# Patient Record
Sex: Female | Born: 1960
Health system: Southern US, Community
[De-identification: ages and names within clinical notes are randomized; demographics above are authoritative.]

## PROBLEM LIST (undated history)

## (undated) DIAGNOSIS — E079 Disorder of thyroid, unspecified: Secondary | ICD-10-CM

## (undated) DIAGNOSIS — K219 Gastro-esophageal reflux disease without esophagitis: Secondary | ICD-10-CM

## (undated) DIAGNOSIS — G809 Cerebral palsy, unspecified: Secondary | ICD-10-CM

## (undated) DIAGNOSIS — I10 Essential (primary) hypertension: Secondary | ICD-10-CM

## (undated) DIAGNOSIS — K589 Irritable bowel syndrome without diarrhea: Secondary | ICD-10-CM

## (undated) HISTORY — PX: LEG SURGERY: SHX1003

## (undated) HISTORY — DX: Cerebral palsy, unspecified: G80.9

## (undated) HISTORY — DX: Disorder of thyroid, unspecified: E07.9

---

## 1997-11-24 ENCOUNTER — Encounter: Admission: RE | Admit: 1997-11-24 | Discharge: 1998-02-22 | Payer: Self-pay | Admitting: Internal Medicine

## 1997-12-21 ENCOUNTER — Other Ambulatory Visit: Admission: RE | Admit: 1997-12-21 | Discharge: 1997-12-21 | Payer: Self-pay | Admitting: Internal Medicine

## 2000-05-21 ENCOUNTER — Ambulatory Visit (HOSPITAL_COMMUNITY): Admission: RE | Admit: 2000-05-21 | Discharge: 2000-05-21 | Payer: Self-pay | Admitting: Internal Medicine

## 2000-05-21 ENCOUNTER — Encounter: Payer: Self-pay | Admitting: Internal Medicine

## 2007-10-27 ENCOUNTER — Encounter: Payer: Self-pay | Admitting: Family Medicine

## 2007-11-02 ENCOUNTER — Ambulatory Visit: Payer: Self-pay | Admitting: Family Medicine

## 2007-11-02 DIAGNOSIS — I10 Essential (primary) hypertension: Secondary | ICD-10-CM | POA: Insufficient documentation

## 2007-11-03 ENCOUNTER — Encounter: Payer: Self-pay | Admitting: Family Medicine

## 2007-11-03 LAB — CONVERTED CEMR LAB
ALT: 9 units/L (ref 0–35)
AST: 15 units/L (ref 0–37)
Albumin: 4 g/dL (ref 3.5–5.2)
Alkaline Phosphatase: 84 units/L (ref 39–117)
BUN: 11 mg/dL (ref 6–23)
CO2: 21 meq/L (ref 19–32)
Calcium: 8.4 mg/dL (ref 8.4–10.5)
Chloride: 104 meq/L (ref 96–112)
Cholesterol: 126 mg/dL (ref 0–200)
Creatinine, Ser: 0.67 mg/dL (ref 0.40–1.20)
Glucose, Bld: 77 mg/dL (ref 70–99)
HDL: 49 mg/dL (ref >39)
LDL Cholesterol: 68 mg/dL (ref 0–99)
Potassium: 3.7 meq/L (ref 3.5–5.3)
Sodium: 138 meq/L (ref 135–145)
TSH: 1.588 microintl units/mL (ref 0.350–5.50)
Total Bilirubin: 0.6 mg/dL (ref 0.3–1.2)
Total CHOL/HDL Ratio: 2.6
Total Protein: 7.7 g/dL (ref 6.0–8.3)
Triglycerides: 47 mg/dL (ref <150)
VLDL: 9 mg/dL (ref 0–40)

## 2007-11-11 ENCOUNTER — Encounter: Admission: RE | Admit: 2007-11-11 | Discharge: 2007-11-11 | Payer: Self-pay | Admitting: Family Medicine

## 2007-12-08 ENCOUNTER — Ambulatory Visit: Payer: Self-pay | Admitting: Family Medicine

## 2007-12-08 DIAGNOSIS — E559 Vitamin D deficiency, unspecified: Secondary | ICD-10-CM | POA: Insufficient documentation

## 2007-12-10 LAB — CONVERTED CEMR LAB: Vit D, 1,25-Dihydroxy: 70 (ref 30–89)

## 2007-12-23 ENCOUNTER — Encounter: Payer: Self-pay | Admitting: Family Medicine

## 2007-12-29 ENCOUNTER — Ambulatory Visit: Payer: Self-pay | Admitting: Family Medicine

## 2008-02-16 ENCOUNTER — Ambulatory Visit: Payer: Self-pay | Admitting: Family Medicine

## 2008-03-17 ENCOUNTER — Ambulatory Visit: Payer: Self-pay | Admitting: Family Medicine

## 2008-03-28 ENCOUNTER — Encounter: Admission: RE | Admit: 2008-03-28 | Discharge: 2008-03-28 | Payer: Self-pay | Admitting: Family Medicine

## 2008-04-26 ENCOUNTER — Telehealth (INDEPENDENT_AMBULATORY_CARE_PROVIDER_SITE_OTHER): Payer: Self-pay | Admitting: *Deleted

## 2009-05-03 ENCOUNTER — Ambulatory Visit: Payer: Self-pay | Admitting: Family Medicine

## 2009-05-04 ENCOUNTER — Encounter: Payer: Self-pay | Admitting: Family Medicine

## 2009-05-07 LAB — CONVERTED CEMR LAB
ALT: 12 units/L (ref 0–35)
AST: 16 units/L (ref 0–37)
Albumin: 4 g/dL (ref 3.5–5.2)
Alkaline Phosphatase: 68 units/L (ref 39–117)
BUN: 18 mg/dL (ref 6–23)
CO2: 20 meq/L (ref 19–32)
Calcium: 8.7 mg/dL (ref 8.4–10.5)
Chloride: 102 meq/L (ref 96–112)
Cholesterol: 171 mg/dL (ref 0–200)
Creatinine, Ser: 0.78 mg/dL (ref 0.40–1.20)
Glucose, Bld: 91 mg/dL (ref 70–99)
HDL: 57 mg/dL (ref >39)
LDL Cholesterol: 101 mg/dL — ABNORMAL HIGH (ref 0–99)
Potassium: 3.5 meq/L (ref 3.5–5.3)
Sodium: 137 meq/L (ref 135–145)
Total Bilirubin: 0.5 mg/dL (ref 0.3–1.2)
Total CHOL/HDL Ratio: 3
Total Protein: 8.1 g/dL (ref 6.0–8.3)
Triglycerides: 64 mg/dL (ref <150)
VLDL: 13 mg/dL (ref 0–40)
Vit D, 25-Hydroxy: 61 ng/mL (ref 30–89)

## 2009-05-09 ENCOUNTER — Encounter: Admission: RE | Admit: 2009-05-09 | Discharge: 2009-05-09 | Payer: Self-pay | Admitting: Family Medicine

## 2009-05-09 LAB — HM MAMMOGRAPHY: HM Mammogram: NORMAL

## 2009-11-07 ENCOUNTER — Ambulatory Visit: Payer: Self-pay | Admitting: Family Medicine

## 2009-11-07 DIAGNOSIS — R1314 Dysphagia, pharyngoesophageal phase: Secondary | ICD-10-CM | POA: Insufficient documentation

## 2009-11-08 ENCOUNTER — Telehealth: Payer: Self-pay | Admitting: Family Medicine

## 2009-11-13 ENCOUNTER — Encounter: Payer: Self-pay | Admitting: Family Medicine

## 2009-11-20 ENCOUNTER — Encounter: Payer: Self-pay | Admitting: Family Medicine

## 2009-11-21 ENCOUNTER — Ambulatory Visit: Payer: Self-pay | Admitting: Family Medicine

## 2009-11-30 ENCOUNTER — Encounter: Payer: Self-pay | Admitting: Family Medicine

## 2009-12-22 ENCOUNTER — Ambulatory Visit: Payer: Self-pay | Admitting: Family Medicine

## 2009-12-22 DIAGNOSIS — L219 Seborrheic dermatitis, unspecified: Secondary | ICD-10-CM | POA: Insufficient documentation

## 2010-05-15 ENCOUNTER — Ambulatory Visit: Payer: Self-pay | Admitting: Family Medicine

## 2010-09-16 ENCOUNTER — Encounter: Payer: Self-pay | Admitting: Internal Medicine

## 2010-09-25 NOTE — Assessment & Plan Note (Signed)
Summary: Shingles.     Vital Signs:  Patient profile:   50 year old female Height:      61 inches Temp:     99.1 degrees F oral Pulse rate:   110 / minute BP sitting:   154 / 92  (left arm) Cuff size:   regular  Vitals Entered By: Kathlene November (November 21, 2009 2:33 PM) CC: rash on neck, face and around left ear- started Friday night- started Prevacid Friday that the GI MD gave her and said that is when the rash started. No itching- painful   Primary Care Provider:  Linford Arnold, C  CC:  rash on neck and face and around left ear- started Friday night- started Prevacid Friday that the GI MD gave her and said that is when the rash started. No itching- painful.  History of Present Illness: rash on neck, face and around right ear- started Friday night- started Prevacid Friday that the GI MD gave her and said that is when the rash started. No itching- painful. Says had shingles several years ago on her left hip   No alleviating or aggrevating sxs.   Current Medications (verified): 1)  Macrodantin 100 Mg  Caps (Nitrofurantoin Macrocrystal) .... Take 1 Tablet By Mouth Once A Day 2)  Oxybutynin Chloride 5 Mg  Tabs (Oxybutynin Chloride) .... Take 1 Tablet By Mouth Three Times A Day 3)  Dicyclomine Hcl 20 Mg  Tabs (Dicyclomine Hcl) .... Take One By Mouth Four Times Daily As Needed 4)  Lisinopril-Hydrochlorothiazide 10-12.5 Mg  Tabs (Lisinopril-Hydrochlorothiazide) .... Take 1 Tablet By Mouth Once A Day 5)  Vitamin D 2000 Unit  Tabs (Cholecalciferol) .... Take 1 Tablet By Mouth Once A Day  Allergies (verified): No Known Drug Allergies  Comments:  Nurse/Medical Assistant: The patient's medications and allergies were reviewed with the patient and were updated in the Medication and Allergy Lists. Kathlene November (November 21, 2009 2:35 PM)  Physical Exam  General:  Well-developed,well-nourished,in no acute distress; alert,appropriate and cooperative throughout examination Ears:  Left TM is clear. No  vesicles on teh ear.  Skin:  Erythematous maculopapular rash on her right ear, left facial cheek and onto her neck. Doesn't cross midline.  Scattered vesicles.    Impression & Recommendations:  Problem # 1:  SHINGLES (ICD-053.9)  Discussed tx with antiviral, famciclovir and topical lidocaine gel. If needs something for pain control stronger then call the office and let me know. Will send viral culture as well to confirm.  Has appt at [facility] next tuesday.    Orders: T- * Misc. Laboratory test [phone])  Complete Medication List: 1)  Macrodantin 100 Mg Caps (Nitrofurantoin macrocrystal) .... Take 1 tablet by mouth once a day 2)  Oxybutynin Chloride 5 Mg Tabs (Oxybutynin chloride) .... Take 1 tablet by mouth three times a day 3)  Dicyclomine Hcl 20 Mg Tabs (Dicyclomine hcl) .... Take one by mouth four times daily as needed 4)  Lisinopril-hydrochlorothiazide 10-12.5 Mg Tabs (Lisinopril-hydrochlorothiazide) .... Take 1 tablet by mouth once a day 5)  Vitamin D 2000 Unit Tabs (Cholecalciferol) .... Take 1 tablet by mouth once a day 6)  Famciclovir 500 Mg Tabs (Famciclovir) .... Take 1 tablet by mouth three times a day for 7 days . 7)  Lidocaine Hcl 2 % Gel (Lidocaine hcl) .... Apply to rash up to 3 x a day for pain relief. Prescriptions: LIDOCAINE HCL 2 % GEL (LIDOCAINE HCL) Apply to rash up to 3 x a day for pain  relief.  #1 tube x 0   Entered and Authorized by:   Nani Gasser MD   Signed by:   Nani Gasser MD on 11/21/2009   Method used:   [facility] to        [facility] [phone]* (retail)       [address] [place], [place]  [address]       Ph: [phone] or [phone]       Fax: [phone]   RxID:   [id number] FAMCICLOVIR 500 MG TABS (FAMCICLOVIR) Take 1 tablet by mouth three times a day for 7 days .  #21 x 0   Entered and Authorized by:   Nani Gasser MD   Signed by:   Nani Gasser MD on 11/21/2009   Method used:    [facility] to        [facility] [phone]* (retail)       [address] [place], [place]  [address]       Ph: [phone] or [phone]       Fax: [phone]   RxID:   [id number]

## 2010-09-25 NOTE — Progress Notes (Signed)
----   Converted from flag ---- ---- 11/08/2009 11:21 AM, Darral Dash wrote:   ---- 11/08/2009 11:21 AM, Kathlene November wrote: Yes you can cancel the appt but make sure to document that pt declined the appt and why  ---- 11/08/2009 10:33 AM, Darral Dash wrote:  Blanca Friend   please see referral note   appt made for pt with GI / but said she did not want appt.  Should I cancel appt? ------------------------------

## 2010-09-25 NOTE — Consult Note (Signed)
Summary: [facility]  [facility]   Imported By: Lanelle Bal 11/17/2009 08:36:19  _____________________________________________________________________  External Attachment:    Type:   Image     Comment:   External Document

## 2010-09-25 NOTE — Assessment & Plan Note (Signed)
Summary: 6 MONTH FU HTN   Vital Signs:  Patient profile:   50 year old female Height:      61 inches Pulse rate:   77 / minute BP sitting:   135 / 78  (left arm) Cuff size:   regular  Vitals Entered By: Avon Gully CMA, Duncan Dull) (May 15, 2010 2:32 PM) CC: f/u BP med, Hypertension Management   CC:  f/u BP med and Hypertension Management.  Hypertension History:      She denies headache, chest pain, palpitations, dyspnea with exertion, orthopnea, PND, peripheral edema, visual symptoms, neurologic problems, syncope, and side effects from treatment.  She notes no problems with any antihypertensive medication side effects.        Positive major cardiovascular risk factors include hypertension.  Negative major cardiovascular risk factors include female age less than 10 years old, negative family history for ischemic heart disease, and non-tobacco-user status.     Current Medications (verified): 1)  Macrodantin 100 Mg  Caps (Nitrofurantoin Macrocrystal) .... Take 1 Tablet By Mouth Once A Day 2)  Oxybutynin Chloride 5 Mg  Tabs (Oxybutynin Chloride) .... Take 1 Tablet By Mouth Three Times A Day 3)  Dicyclomine Hcl 20 Mg  Tabs (Dicyclomine Hcl) .... Take One By Mouth Four Times Daily As Needed 4)  Lisinopril-Hydrochlorothiazide 10-12.5 Mg  Tabs (Lisinopril-Hydrochlorothiazide) .... Take 1 Tablet By Mouth Once A Day 5)  Vitamin D 2000 Unit  Tabs (Cholecalciferol) .... Take 1 Tablet By Mouth Once A Day 6)  Lidocaine Hcl 2 % Gel (Lidocaine Hcl) .... Apply To Rash Up To 3 X A Day For Pain Relief. 7)  Triamcinolone Acetonide 0.5 % Crea (Triamcinolone Acetonide) .... Apply Once A Day As Needed To Affected Area.  Allergies (verified): No Known Drug Allergies  Comments:  Nurse/Medical Assistant: The patient's medications and allergies were reviewed with the patient and were updated in the Medication and Allergy Lists. Avon Gully CMA, Duncan Dull) (May 15, 2010 2:33 PM)  Physical  Exam  General:  Well-developed,well-nourished,in no acute distress; alert,appropriate and cooperative throughout examination Head:  Normocephalic and atraumatic without obvious abnormalities. No apparent alopecia or balding. Eyes:  No corneal or conjunctival inflammation noted. EOMI. Perrla.  Lungs:  Normal respiratory effort, chest expands symmetrically. Lungs are clear to auscultation, no crackles or wheezes. Heart:  Normal rate and regular rhythm. S1 and S2 normal without gallop, murmur, click, rub or other extra sounds. Extremities:  No LE edema.    Impression & Recommendations:  Problem # 1:  ESSENTIAL HYPERTENSION, BENIGN (ICD-401.1)  At goal today.  Due for labs.  Her updated medication list for this problem includes:    Lisinopril-hydrochlorothiazide 10-12.5 Mg Tabs (Lisinopril-hydrochlorothiazide) .Marland Kitchen... Take 1 tablet by mouth once a day  BP today: 135/78 Prior BP: 128/73 (12/22/2009)  Prior 10 Yr Risk Heart Disease: 3 % (05/03/2009)  Labs Reviewed: K+: 3.5 (05/04/2009) Creat: : 0.78 (05/04/2009)   Chol: 171 (05/04/2009)   HDL: 57 (05/04/2009)   LDL: 101 (05/04/2009)   TG: 64 (05/04/2009)  Orders: T-Comprehensive Metabolic Panel ([id number]) T-Lipid Profile ([id number])  Complete Medication List: 1)  Macrodantin 100 Mg Caps (Nitrofurantoin macrocrystal) .... Take 1 tablet by mouth once a day 2)  Oxybutynin Chloride 5 Mg Tabs (Oxybutynin chloride) .... Take 1 tablet by mouth three times a day 3)  Dicyclomine Hcl 20 Mg Tabs (Dicyclomine hcl) .... Take one by mouth four times daily as needed 4)  Lisinopril-hydrochlorothiazide 10-12.5 Mg Tabs (Lisinopril-hydrochlorothiazide) .... Take 1 tablet by mouth once a  day 5)  Vitamin D 2000 Unit Tabs (Cholecalciferol) .... Take 1 tablet by mouth once a day 6)  Lidocaine Hcl 2 % Gel (Lidocaine hcl) .... Apply to rash up to 3 x a day for pain relief. 7)  Triamcinolone Acetonide 0.5 % Crea (Triamcinolone acetonide) .... Apply once a  day as needed to affected area.  Hypertension Assessment/Plan:      The patient's hypertensive risk group is category A: No risk factors and no target organ damage.  Her calculated 10 year risk of coronary heart disease is 4 %.  Today's blood pressure is 135/78.  Her blood pressure goal is < 140/90.  Contraindications/Deferment of Procedures/Staging:    Test/Procedure: FLU VAX    Reason for deferment: patient declined   Patient Instructions: 1)  Go for fasting labs in the next month.   2)  Call Dr. Yevonne Pax office to schedule a follow up visit.  3)  Please schedule a follow-up appointment in 6 months for blood pressure .

## 2010-09-25 NOTE — Assessment & Plan Note (Signed)
Summary: 6 MONTH FU HTN, new onset dysphagia   Vital Signs:  Patient profile:   50 year old female Height:      61 inches Pulse rate:   107 / minute BP sitting:   132 / 79  (left arm) Cuff size:   regular  Vitals Entered By: Kathlene November (November 07, 2009 2:24 PM) CC: followup BP   Primary Care Provider:  Linford Arnold, C  CC:  followup BP.  History of Present Illness: Anna Bowen is a 50 year old woman presenting for f/u of BP. She has no symptoms or concerns. Her blood pressure today is 132/79. She takes her lisinopril-HCTZ daily and needs a refill today. She denies headache, chest pain, palpitations, dyspnea with exertion, peripheral edema, visual symptoms, neurologic problems, or syncope. She notes no problems with any antihypertensive medication side effects.  For the past month she has noticed that food seems to get stuck in her mid-esophagus every time she eats. Only solids, no liquids. Laying down or eating something else makes it better. She is always eventually able to get it down, but she has vomited from this problem before. She has had some loose stools occ, but no constipation. Sometimes experiences brash taste. No changes in diet. No history of reflux. No abdominal pain. Doesn't have her bottom teeth to help her chew but this is not new and her dysphagia is.  Denies any change in her diet recently.    Current Medications (verified): 1)  Macrodantin 100 Mg  Caps (Nitrofurantoin Macrocrystal) .... Take 1 Tablet By Mouth Once A Day 2)  Oxybutynin Chloride 5 Mg  Tabs (Oxybutynin Chloride) .... Take 1 Tablet By Mouth Three Times A Day 3)  Dicyclomine Hcl 20 Mg  Tabs (Dicyclomine Hcl) .... Take One By Mouth Four Times Daily As Needed 4)  Lisinopril-Hydrochlorothiazide 10-12.5 Mg  Tabs (Lisinopril-Hydrochlorothiazide) .... Take 1 Tablet By Mouth Once A Day 5)  Vitamin D 2000 Unit  Tabs (Cholecalciferol) .... Take 1 Tablet By Mouth Once A Day  Allergies (verified): No Known Drug  Allergies  Comments:  Nurse/Medical Assistant: The patient's medications and allergies were reviewed with the patient and were updated in the Medication and Allergy Lists. Kathlene November (November 07, 2009 2:25 PM)  Physical Exam  General:  Well-developed, well-nourised female sitting in wheelchair in no acute distress.  Eyes:  Asymmetric gaze. PERRLA. Sclera clear with no injection.  Mouth:  Oropharynx pink and moist.  Neck:  Supple with no lymphadenopathy or thyromegaly. Chest Wall:  No deformities, masses, or tenderness noted. Lungs:  Normal respiratory effort, chest expands symmetrically. Lungs are clear to auscultation, no crackles or wheezes. Heart:  Normal rate and regular rhythm. S1 and S2 normal without gallop, murmur, click, rub or other extra sounds. Msk:  Posture kyphotic.  Pulses:  Radial 2+ bilaterally.  Extremities:  No cyanosis, clubbing, or edema.  Skin:  no suspicious lesions.   Psych:  Alert and cooperative with normal concentration and attention.    Impression & Recommendations:  Problem # 1:  ESSENTIAL HYPERTENSION, BENIGN (ICD-401.1) BP today 132/79. Continue lisinopril-HCTZ. Follow up in 6 months to recheck BP and labs.   Her updated medication list for this problem includes:    Lisinopril-hydrochlorothiazide 10-12.5 Mg Tabs (Lisinopril-hydrochlorothiazide) .Marland Kitchen... Take 1 tablet by mouth once a day  Problem # 2:  DYSPHAGIA, PHARYNGOESOPHAGEAL PHASE ([id number]) Assessment: New  Dysphagia to solids at every meal in setting of intermittent brash taste suggests possible stricture secondary to GERD. Will refer to GI for  upper endoscopy and possible dilation. In meantime try to eat mostly soft foods that don't require alot of chewing.   Orders: Gastroenterology Referral (GI)  Complete Medication List: 1)  Macrodantin 100 Mg Caps (Nitrofurantoin macrocrystal) .... Take 1 tablet by mouth once a day 2)  Oxybutynin Chloride 5 Mg Tabs (Oxybutynin chloride) .... Take 1  tablet by mouth three times a day 3)  Dicyclomine Hcl 20 Mg Tabs (Dicyclomine hcl) .... Take one by mouth four times daily as needed 4)  Lisinopril-hydrochlorothiazide 10-12.5 Mg Tabs (Lisinopril-hydrochlorothiazide) .... Take 1 tablet by mouth once a day 5)  Vitamin D 2000 Unit Tabs (Cholecalciferol) .... Take 1 tablet by mouth once a day  Patient Instructions: 1)  Please schedule a follow-up appointment in 6 months for blood pressure and labs. 2)  We will call you with an appointment for your swallowing problem.   Prescriptions: DICYCLOMINE HCL 20 MG  TABS (DICYCLOMINE HCL) take one by mouth four times daily as needed  #120 x 3   Entered and Authorized by:   Nani Gasser MD   Signed by:   Nani Gasser MD on 11/07/2009   Method used:   [facility] to        [facility] [phone]* (retail)       [address] [place], [place]  [address]       Ph: [phone] or [phone]       Fax: [phone]   RxID:   [id number] LISINOPRIL-HYDROCHLOROTHIAZIDE 10-12.5 MG  TABS (LISINOPRIL-HYDROCHLOROTHIAZIDE) Take 1 tablet by mouth once a day  #90 Tablet x 2   Entered and Authorized by:   Nani Gasser MD   Signed by:   Nani Gasser MD on 11/07/2009   Method used:   Electronically to        [facility] [phone]* (retail)       [address] [place], [place]  [address]       Ph: [phone] or [phone]       Fax: [phone]   RxID:   [id number]

## 2010-09-25 NOTE — Assessment & Plan Note (Signed)
Summary: Shingles, seb dermatitis   Vital Signs:  Patient profile:   50 year old female Height:      61 inches O2 Sat:      98 % on Room air Temp:     99.7 degrees F oral Pulse rate:   112 / minute BP sitting:   128 / 73  (left arm) Cuff size:   regular  Vitals Entered By: Payton Spark CMA (December 22, 2009 1:57 PM)  O2 Flow:  Room air CC: F/U rash   Primary Care Provider:  Linford Arnold, C  CC:  F/U rash.  History of Present Illness: Completed medicaition and overall rash is improved.  Area behind the right ear It is itchy and has been there about 3 weeks. Never had it before.   Allergies: No Known Drug Allergies  Physical Exam  General:  Well-developed,well-nourished,in no acute distress; alert,appropriate and cooperative throughout examination Skin:  Area behind right ear is dry adn mildly erythenatous and extends into the scalp area.    Impression & Recommendations:  Problem # 1:  SHINGLES (ICD-053.9) Culture came back negative but exam was very consistant with shingles. Resolved.   Problem # 2:  DERMATITIS, SEBORRHEIC (ICD-690.10) Dsicussed treatment with selsun blue for entire scallp and then use the triamcinolone topically to the area behind the right ear. Call if not better in 2 weeks.   Problem # 3:  ESSENTIAL HYPERTENSION, BENIGN (ICD-401.1) Looks good today.   Her updated medication list for this problem includes:    Lisinopril-hydrochlorothiazide 10-12.5 Mg Tabs (Lisinopril-hydrochlorothiazide) .Marland Kitchen... Take 1 tablet by mouth once a day  Complete Medication List: 1)  Macrodantin 100 Mg Caps (Nitrofurantoin macrocrystal) .... Take 1 tablet by mouth once a day 2)  Oxybutynin Chloride 5 Mg Tabs (Oxybutynin chloride) .... Take 1 tablet by mouth three times a day 3)  Dicyclomine Hcl 20 Mg Tabs (Dicyclomine hcl) .... Take one by mouth four times daily as needed 4)  Lisinopril-hydrochlorothiazide 10-12.5 Mg Tabs (Lisinopril-hydrochlorothiazide) .... Take 1 tablet by  mouth once a day 5)  Vitamin D 2000 Unit Tabs (Cholecalciferol) .... Take 1 tablet by mouth once a day 6)  Lidocaine Hcl 2 % Gel (Lidocaine hcl) .... Apply to rash up to 3 x a day for pain relief. 7)  Triamcinolone Acetonide 0.5 % Crea (Triamcinolone acetonide) .... Apply once a day as needed to affected area.  Patient Instructions: 1)  Use Selsun Blue shampoo. Can use the topical steroid cream (triamcinolone) once a day as needed. Give skin a break if use for more than 2 weeks.  Prescriptions: TRIAMCINOLONE ACETONIDE 0.5 % CREA (TRIAMCINOLONE ACETONIDE) apply once a day as needed to affected area.  #20 gram. x 1   Entered and Authorized by:   Nani Gasser MD   Signed by:   Nani Gasser MD on 12/22/2009   Method used:   Electronically to        [facility] [phone]* (retail)       [address] [place], [place]  [address]       Ph: [phone] or [phone]       Fax: [phone]   RxID:   [id number]

## 2010-09-28 NOTE — Letter (Signed)
Summary: Letter with Esophagram Results/[facility]  Letter with Esophagram Results/[facility]   Imported By: Lanelle Bal 12/09/2009 10:23:18  _____________________________________________________________________  External Attachment:    Type:   Image     Comment:   External Document

## 2011-01-23 ENCOUNTER — Other Ambulatory Visit: Payer: Self-pay | Admitting: Family Medicine

## 2011-01-24 ENCOUNTER — Telehealth: Payer: Self-pay | Admitting: Family Medicine

## 2011-01-24 DIAGNOSIS — K279 Peptic ulcer, site unspecified, unspecified as acute or chronic, without hemorrhage or perforation: Secondary | ICD-10-CM

## 2011-01-24 MED ORDER — DICYCLOMINE HCL 20 MG PO TABS: 20.0000 mg | ORAL_TABLET | Freq: Four times a day (QID) | ORAL | Status: DC | PRN

## 2011-01-25 NOTE — Telephone Encounter (Signed)
Close encounter. Anna Newcomer, LPN Domingo Dimes

## 2011-01-27 ENCOUNTER — Encounter: Payer: Self-pay | Admitting: Family Medicine

## 2011-01-28 ENCOUNTER — Encounter: Payer: Self-pay | Admitting: Family Medicine

## 2011-01-28 ENCOUNTER — Ambulatory Visit (INDEPENDENT_AMBULATORY_CARE_PROVIDER_SITE_OTHER): Payer: PRIVATE HEALTH INSURANCE | Admitting: Family Medicine

## 2011-01-28 VITALS — BP 157/80 | HR 125 | Temp 98.6°F

## 2011-01-28 DIAGNOSIS — G809 Cerebral palsy, unspecified: Secondary | ICD-10-CM

## 2011-01-28 DIAGNOSIS — R1314 Dysphagia, pharyngoesophageal phase: Secondary | ICD-10-CM

## 2011-01-28 DIAGNOSIS — I10 Essential (primary) hypertension: Secondary | ICD-10-CM

## 2011-01-28 DIAGNOSIS — J689 Unspecified respiratory condition due to chemicals, gases, fumes and vapors: Secondary | ICD-10-CM

## 2011-01-28 MED ORDER — WHEELCHAIR MISC: Status: DC

## 2011-01-28 MED ORDER — AMBULATORY NON FORMULARY MEDICATION: Status: DC
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
## 2011-01-28 NOTE — Assessment & Plan Note (Signed)
Is not well-controlled today but she did just restart her medications. I would like to see her back in one month to make sure that it is well controlled and to make sure that we don't need to adjust her medications.

## 2011-01-28 NOTE — Assessment & Plan Note (Signed)
I did give her a new prescription to take to medical suppository for wheelchair and for forearm crutches. We'll also make a home health referral for her to have a nurse evaluate the new permit that they are in and help make recommendations to make it safe for her and more handicap accessible.

## 2011-01-28 NOTE — Assessment & Plan Note (Signed)
She never saw the gastroenterologist but says that her symptoms completely resolved.

## 2011-02-26 ENCOUNTER — Encounter: Payer: Self-pay | Admitting: Family Medicine

## 2011-02-26 ENCOUNTER — Ambulatory Visit (INDEPENDENT_AMBULATORY_CARE_PROVIDER_SITE_OTHER): Payer: PRIVATE HEALTH INSURANCE | Admitting: Family Medicine

## 2011-02-26 ENCOUNTER — Other Ambulatory Visit: Payer: Self-pay | Admitting: Family Medicine

## 2011-02-26 VITALS — BP 135/85 | HR 71

## 2011-02-26 DIAGNOSIS — I10 Essential (primary) hypertension: Secondary | ICD-10-CM

## 2011-02-26 MED ORDER — LISINOPRIL-HYDROCHLOROTHIAZIDE 10-12.5 MG PO TABS: 1.0000 | ORAL_TABLET | Freq: Every day | ORAL | Status: DC
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
## 2011-02-27 ENCOUNTER — Telehealth: Payer: Self-pay | Admitting: Family Medicine

## 2011-02-27 DIAGNOSIS — I10 Essential (primary) hypertension: Secondary | ICD-10-CM

## 2011-02-27 LAB — COMPLETE METABOLIC PANEL WITH GFR
ALT: 16 U/L (ref 0–35)
AST: 19 U/L (ref 0–37)
Alkaline Phosphatase: 69 U/L (ref 39–117)
CO2: 20 mEq/L (ref 19–32)
Sodium: 135 mEq/L (ref 135–145)
Total Bilirubin: 0.4 mg/dL (ref 0.3–1.2)
Total Protein: 7.4 g/dL (ref 6.0–8.3)

## 2011-02-27 LAB — LIPID PANEL: LDL Cholesterol: 103 mg/dL — ABNORMAL HIGH (ref 0–99)

## 2011-02-27 NOTE — Telephone Encounter (Signed)
Callpt: Labs look great!

## 2011-02-28 LAB — TSH: TSH: 1.253 u[IU]/mL (ref 0.350–4.500)

## 2011-02-28 MED ORDER — FLUTICASONE PROPIONATE 50 MCG/ACT NA SUSP: 2.0000 | Freq: Every day | NASAL | Status: DC

## 2011-02-28 NOTE — Telephone Encounter (Signed)
Advised of med sent and added lab.

## 2011-02-28 NOTE — Telephone Encounter (Signed)
Advised mom of results and states you were to check thyroid as well? Also, a nasal spray was to be RX'd.

## 2011-02-28 NOTE — Telephone Encounter (Signed)
Nasal spray sent.  I didn't check her thyroid. WE can do that anytime she wants to go.  I looked at my notes and I dont' see a note about ordering but we can. Order entered.

## 2011-03-01 ENCOUNTER — Telehealth: Payer: Self-pay | Admitting: Family Medicine

## 2011-03-01 NOTE — Telephone Encounter (Signed)
Pt informed thyroid level normal. Jarvis Newcomer, LPN Domingo Dimes

## 2011-03-01 NOTE — Telephone Encounter (Signed)
Call patient: Thyroid level is normal.

## 2011-04-03 ENCOUNTER — Other Ambulatory Visit: Payer: Self-pay | Admitting: Family Medicine

## 2011-04-19 ENCOUNTER — Other Ambulatory Visit: Payer: Self-pay | Admitting: Family Medicine

## 2011-04-24 ENCOUNTER — Other Ambulatory Visit: Payer: Self-pay | Admitting: Family Medicine

## 2011-08-05 ENCOUNTER — Other Ambulatory Visit: Payer: Self-pay | Admitting: Family Medicine

## 2011-09-28 ENCOUNTER — Other Ambulatory Visit: Payer: Self-pay | Admitting: Family Medicine

## 2011-10-14 ENCOUNTER — Other Ambulatory Visit: Payer: Self-pay | Admitting: *Deleted

## 2011-10-14 MED ORDER — NITROFURANTOIN MACROCRYSTAL 100 MG PO CAPS: 100.0000 mg | ORAL_CAPSULE | Freq: Every day | ORAL | Status: DC

## 2011-10-14 MED ORDER — DICYCLOMINE HCL 20 MG PO TABS: 20.0000 mg | ORAL_TABLET | Freq: Two times a day (BID) | ORAL | Status: DC | PRN

## 2011-11-05 ENCOUNTER — Other Ambulatory Visit: Payer: Self-pay | Admitting: Family Medicine

## 2011-11-26 ENCOUNTER — Telehealth: Payer: Self-pay | Admitting: *Deleted

## 2011-11-26 MED ORDER — LISINOPRIL-HYDROCHLOROTHIAZIDE 10-12.5 MG PO TABS: 1.0000 | ORAL_TABLET | Freq: Every day | ORAL | Status: DC

## 2012-01-26 ENCOUNTER — Other Ambulatory Visit: Payer: Self-pay | Admitting: Family Medicine

## 2012-02-02 ENCOUNTER — Other Ambulatory Visit: Payer: Self-pay | Admitting: Family Medicine

## 2012-02-15 ENCOUNTER — Other Ambulatory Visit: Payer: Self-pay | Admitting: Family Medicine

## 2012-03-02 ENCOUNTER — Telehealth: Payer: Self-pay | Admitting: Family Medicine

## 2012-03-02 NOTE — Telephone Encounter (Signed)
Call pt: Got letter form [employer] and they felt she had neuropathy on exam. I would like her to make appt sometime this month or next to better evaluate this.
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
## 2012-03-09 ENCOUNTER — Ambulatory Visit (INDEPENDENT_AMBULATORY_CARE_PROVIDER_SITE_OTHER): Payer: PRIVATE HEALTH INSURANCE | Admitting: Family Medicine

## 2012-03-09 ENCOUNTER — Encounter: Payer: Self-pay | Admitting: Family Medicine

## 2012-03-09 VITALS — BP 134/84 | HR 90

## 2012-03-09 DIAGNOSIS — I10 Essential (primary) hypertension: Secondary | ICD-10-CM

## 2012-03-09 DIAGNOSIS — G629 Polyneuropathy, unspecified: Secondary | ICD-10-CM

## 2012-03-09 DIAGNOSIS — G609 Hereditary and idiopathic neuropathy, unspecified: Secondary | ICD-10-CM

## 2012-03-09 MED ORDER — LISINOPRIL-HYDROCHLOROTHIAZIDE 10-12.5 MG PO TABS: 1.0000 | ORAL_TABLET | Freq: Every day | ORAL | Status: DC

## 2012-03-09 MED ORDER — OXYBUTYNIN CHLORIDE 5 MG PO TABS: 5.0000 mg | ORAL_TABLET | Freq: Three times a day (TID) | ORAL | Status: DC

## 2012-03-09 NOTE — Progress Notes (Signed)
  Subjective:    Patient ID: Anna Bowen, female    DOB: 1960-10-25, 51 y.o.   MRN: [id number]  HPI She had a visit from a nurse through her health insurance, [employer]. The detected on exam that she had some signs of peripheral neuropathy and encourage that she followup for further evaluation.No pain or burning in her feet. Hx of CP so can walk a couple of steps but rarely.  Uses forearm crutches, but she is primarily wheelchair bound. She says she really tries to take care of her feet.   Hypertension-no CP of SOB. Out of her lisinopri hctz. No lightheadedness or dizziness.    Review of Systems     Objective:   Physical Exam  Constitutional: She is oriented to person, place, and time. She appears well-developed and well-nourished.  HENT:  Head: Normocephalic and atraumatic.  Cardiovascular: Normal rate, regular rhythm and normal heart sounds.   Pulmonary/Chest: Effort normal and breath sounds normal.  Musculoskeletal:       Abnormal monofilament exam on feet.  No sensation on the bottom but can feel slightly on the tops of both feet. Trace swelling of both feet bilaterally.   Neurological: She is alert and oriented to person, place, and time.  Skin: Skin is warm and dry.  Psychiatric: She has a normal mood and affect. Her behavior is normal.          Assessment & Plan:  Neuropahty without pain - Dsicussed appropriate foot care.  Check daily for sore, sores, etc to reduce infection.  Cone in for any foot cut or abrasion. Will check TSH, vitamin B12, CBC, etc to rule out endocrine causes. Will also rule out diabetes.  May be related to her CP.  She doesn't have any idea how long they have been numb.    Hypertension - Doing well. F/U in 6 months. REfills send to the pharmacy.

## 2012-03-10 ENCOUNTER — Encounter: Payer: Self-pay | Admitting: *Deleted

## 2012-03-10 LAB — COMPLETE METABOLIC PANEL WITH GFR
ALT: 15 U/L (ref 0–35)
Albumin: 4.1 g/dL (ref 3.5–5.2)
CO2: 25 mEq/L (ref 19–32)
Calcium: 9.4 mg/dL (ref 8.4–10.5)
Chloride: 104 mEq/L (ref 96–112)
Creat: 0.8 mg/dL (ref 0.50–1.10)
GFR, Est African American: >89 mL/min
Potassium: 3.7 mEq/L (ref 3.5–5.3)
Sodium: 138 mEq/L (ref 135–145)
Total Protein: 7.8 g/dL (ref 6.0–8.3)

## 2012-03-10 LAB — CBC
HCT: 37.7 % (ref 36.0–46.0)
Hemoglobin: 12.4 g/dL (ref 12.0–15.0)
MCHC: 32.9 g/dL (ref 30.0–36.0)
MCV: 97.7 fL (ref 78.0–100.0)
RDW: 13 % (ref 11.5–15.5)

## 2012-03-10 LAB — FERRITIN: Ferritin: 552 ng/mL — ABNORMAL HIGH (ref 10–291)

## 2012-05-06 ENCOUNTER — Other Ambulatory Visit: Payer: Self-pay | Admitting: Family Medicine

## 2012-07-06 ENCOUNTER — Other Ambulatory Visit: Payer: Self-pay | Admitting: Family Medicine

## 2012-08-05 ENCOUNTER — Other Ambulatory Visit: Payer: Self-pay | Admitting: Family Medicine

## 2012-09-16 ENCOUNTER — Other Ambulatory Visit: Payer: Self-pay | Admitting: Family Medicine

## 2012-09-16 DIAGNOSIS — G809 Cerebral palsy, unspecified: Secondary | ICD-10-CM

## 2012-09-16 DIAGNOSIS — N390 Urinary tract infection, site not specified: Secondary | ICD-10-CM

## 2012-12-14 ENCOUNTER — Telehealth: Payer: Self-pay | Admitting: Family Medicine

## 2012-12-14 ENCOUNTER — Other Ambulatory Visit: Payer: Self-pay | Admitting: Family Medicine

## 2012-12-14 DIAGNOSIS — G809 Cerebral palsy, unspecified: Secondary | ICD-10-CM

## 2012-12-14 NOTE — Telephone Encounter (Signed)
Ok to fill for one month each but needs appt. I think her insurance co is working to help with transportation for her.
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
## 2013-01-19 ENCOUNTER — Ambulatory Visit: Payer: PRIVATE HEALTH INSURANCE | Admitting: Family Medicine

## 2013-02-21 ENCOUNTER — Other Ambulatory Visit: Payer: Self-pay | Admitting: Family Medicine

## 2013-03-15 ENCOUNTER — Other Ambulatory Visit: Payer: Self-pay | Admitting: Family Medicine

## 2013-08-31 ENCOUNTER — Other Ambulatory Visit: Payer: Self-pay | Admitting: Family Medicine

## 2013-09-23 ENCOUNTER — Other Ambulatory Visit: Payer: Self-pay | Admitting: Family Medicine

## 2013-10-08 ENCOUNTER — Encounter: Payer: PRIVATE HEALTH INSURANCE | Admitting: Family Medicine

## 2013-10-12 ENCOUNTER — Encounter: Payer: PRIVATE HEALTH INSURANCE | Admitting: Family Medicine

## 2013-10-22 ENCOUNTER — Other Ambulatory Visit: Payer: Self-pay | Admitting: Family Medicine

## 2013-10-26 ENCOUNTER — Telehealth: Payer: Self-pay | Admitting: Family Medicine

## 2013-10-26 MED ORDER — OXYBUTYNIN CHLORIDE 5 MG PO TABS: ORAL_TABLET | ORAL | Status: DC

## 2013-10-26 NOTE — Telephone Encounter (Signed)
Dianne who is the aunt of the pt called. Pt needs refill on [facility]butynin[facility] [address]  Please contact Dianne at [phone] when rx has been sent.

## 2013-10-26 NOTE — Telephone Encounter (Signed)
Dianne called and informed of rx being sent.Loralee PacasBarkley, [place]

## 2013-10-26 NOTE — Telephone Encounter (Signed)
Aunt, Dianne called.  Patient needs refill on oxybutynin, [facility]. Appt has been scheduled for 3/17. Please call Dianne at [phone] when you [facility]. -

## 2013-10-28 ENCOUNTER — Other Ambulatory Visit: Payer: Self-pay

## 2013-10-28 MED ORDER — OXYBUTYNIN CHLORIDE 5 MG PO TABS: ORAL_TABLET | ORAL | Status: DC

## 2013-10-28 NOTE — Telephone Encounter (Signed)
Sent in a 15 day prescription.

## 2013-11-09 ENCOUNTER — Encounter: Payer: Self-pay | Admitting: Family Medicine

## 2013-11-09 ENCOUNTER — Ambulatory Visit (INDEPENDENT_AMBULATORY_CARE_PROVIDER_SITE_OTHER): Payer: Medicare Other | Admitting: Family Medicine

## 2013-11-09 VITALS — BP 134/75 | HR 69 | Wt 95.0 lb

## 2013-11-09 DIAGNOSIS — E639 Nutritional deficiency, unspecified: Secondary | ICD-10-CM

## 2013-11-09 DIAGNOSIS — N318 Other neuromuscular dysfunction of bladder: Secondary | ICD-10-CM

## 2013-11-09 DIAGNOSIS — E538 Deficiency of other specified B group vitamins: Secondary | ICD-10-CM

## 2013-11-09 DIAGNOSIS — K589 Irritable bowel syndrome without diarrhea: Secondary | ICD-10-CM | POA: Insufficient documentation

## 2013-11-09 DIAGNOSIS — R350 Frequency of micturition: Secondary | ICD-10-CM

## 2013-11-09 DIAGNOSIS — N3281 Overactive bladder: Secondary | ICD-10-CM

## 2013-11-09 DIAGNOSIS — I10 Essential (primary) hypertension: Secondary | ICD-10-CM

## 2013-11-09 DIAGNOSIS — G809 Cerebral palsy, unspecified: Secondary | ICD-10-CM

## 2013-11-09 DIAGNOSIS — E041 Nontoxic single thyroid nodule: Secondary | ICD-10-CM

## 2013-11-09 MED ORDER — DICYCLOMINE HCL 20 MG PO TABS: ORAL_TABLET | ORAL | Status: DC

## 2013-11-09 MED ORDER — NITROFURANTOIN MACROCRYSTAL 100 MG PO CAPS: ORAL_CAPSULE | ORAL | Status: DC

## 2013-11-09 MED ORDER — OXYBUTYNIN CHLORIDE 5 MG PO TABS: ORAL_TABLET | ORAL | Status: DC

## 2013-11-09 NOTE — Progress Notes (Signed)
Subjective:    Patient ID: Anna Bowen, female    DOB: [birth year], 53 y.o.   MRN: [id number]  HPI Hypertension- Pt denies chest pain, SOB, dizziness, or heart palpitations.  Taking meds as directed w/o problems.  Denies medication side effects.  Says ran out about 1-2 months ago.  She is onlly eating once a day.  Has been drinking about 16 ounces of soda a day.   Follow up OAB - on ditropan and macrodantin.  Ran out about a week ago.  She's been on Macrodantin prophylactically for well over 15 years. She says when she doesn't take it she has urgency with urination.  Unfortunately her living situation has been somewhat difficult. Her mother was her primary caretaker and she passed away in 09/15/2022. She does live with her brother but he really does not provide any assistance. She needs assistance with bathing and eating getting dressed and putting her socks on. Her and is here with her today. Her aunt has been trying to talk her into living in an assisted living center where she can get help with bathing and have 3 meals a day provided. Currently she has a manual wheelchair which she is unable to propel on her own. The she is unable to get to the kitchen to prepare food for herself, so she is only eating once a day. She is drinking 8-12 ounces of soda a day, instead of water. And she's been out of most of her medications for the last 6-7 weeks.  Irritable bowel syndrome-she's also been out of her dicyclomine and would like to have it refilled. She feels it's very helpful for stomach pain and cramping. Review of Systems     Objective:   Physical Exam  Constitutional: She is oriented to person, place, and time. She appears well-developed and well-nourished.  HENT:  Head: Normocephalic and atraumatic.  Neck: Neck supple. No thyromegaly present.  Palpable nodule on the right side of the thyroid gland.  Cardiovascular: Normal rate, regular rhythm and normal heart sounds.   Pulmonary/Chest:  Effort normal and breath sounds normal.  Musculoskeletal: She exhibits no edema.  Lymphadenopathy:    She has no cervical adenopathy.  Neurological: She is alert and oriented to person, place, and time.  Skin: Skin is warm and dry.  Psychiatric: She has a normal mood and affect. Her behavior is normal.          Assessment & Plan:  HTN- Well controlled off of medication for one to 2 months.. Will remove from her med list. Though I suspect that the decrease in blood pressure is directly related to her weight loss, poor nutrition, and decreased fluid intake. If she's able to improve her access to nutrition and I suspect her blood pressure would start to rise again  OAB - will refill the ditropan. I did go ahead and refill the Macrodantin but explained her that she has to drink at least 6-8 glasses of water a day. It's important that this medication only be taken when she is getting plenty of hydration. She's also been on it long enough that I really think she should consider a trial off of it for a period of time to see how well she does and if she still really needs it. She seemed extremely hesitant to do this. She will be getting a new provider in the next one to 2 months through a company that provides home visits. I strongly encouraged her to discuss this with them.  I really think she should consider coming off of it. I did go ahead and refill the digit pain as well.  Cerebral palsy-unfortunately she is in a poor living environment and situation. Her brother works part-time and so she is home alone a good portion of the week. She is unable to prepare her own the ulcer and that time. She would definitely benefit from either being an assisted living center or possibly an electric wheelchair which would provide some extra mobility. I would like to get home health involved. It I think she would also benefit from physical therapy as well as occupational therapy to help her learn to do some things on  her own that her mother was doing for her for years.  Irritable bowel syndrome-will refill dicyclomine today.  I also palpated a nodule on the right side of her thyroid gland. Would like to get an ultrasound for further evaluation.  Poor nutrition-I. would like to recheck B12 ferritin etc. since she's had poor intake over the last few months.  Also discussed the need for screening colonoscopy based on her age. Encouraged her to at least think about it and reviewed the options for colon cancer screening.  Time spent 40 minutes, greater than 50% spent counseling about blood pressure, overactive bladder, poor nutrition, IBS, and cerebral palsy.

## 2013-11-10 LAB — CBC
HCT: 36.8 % (ref 36.0–46.0)
Hemoglobin: 12.4 g/dL (ref 12.0–15.0)
MCH: 32 pg (ref 26.0–34.0)
MCHC: 33.7 g/dL (ref 30.0–36.0)
MCV: 95.1 fL (ref 78.0–100.0)
Platelets: 203 10*3/uL (ref 150–400)
RBC: 3.87 MIL/uL (ref 3.87–5.11)
RDW: 13.8 % (ref 11.5–15.5)
WBC: 3.6 10*3/uL — ABNORMAL LOW (ref 4.0–10.5)

## 2013-11-10 LAB — COMPLETE METABOLIC PANEL WITH GFR
ALK PHOS: 73 U/L (ref 39–117)
ALT: 11 U/L (ref 0–35)
AST: 18 U/L (ref 0–37)
Albumin: 4.1 g/dL (ref 3.5–5.2)
BUN: 18 mg/dL (ref 6–23)
CALCIUM: 9.3 mg/dL (ref 8.4–10.5)
CHLORIDE: 104 meq/L (ref 96–112)
CO2: 26 mEq/L (ref 19–32)
Creat: 0.8 mg/dL (ref 0.50–1.10)
GFR, Est African American: >89 mL/min
GFR, Est Non African American: 85 mL/min
Glucose, Bld: 101 mg/dL — ABNORMAL HIGH (ref 70–99)
Potassium: 3.5 mEq/L (ref 3.5–5.3)
SODIUM: 139 meq/L (ref 135–145)
TOTAL PROTEIN: 7.3 g/dL (ref 6.0–8.3)
Total Bilirubin: 0.5 mg/dL (ref 0.2–1.2)

## 2013-11-10 LAB — VITAMIN B12: Vitamin B-12: 250 pg/mL (ref 211–911)

## 2013-11-10 LAB — VITAMIN D 25 HYDROXY (VIT D DEFICIENCY, FRACTURES): Vit D, 25-Hydroxy: 39 ng/mL (ref 30–89)

## 2013-11-12 ENCOUNTER — Other Ambulatory Visit: Payer: Medicare Other

## 2013-11-12 LAB — POCT URINALYSIS DIPSTICK
Blood, UA: NEGATIVE
Glucose, UA: NEGATIVE
Ketones, UA: NEGATIVE
Leukocytes, UA: NEGATIVE
Nitrite, UA: NEGATIVE
PROTEIN UA: NEGATIVE
SPEC GRAV UA: 1.02
UROBILINOGEN UA: 0.2
pH, UA: 5

## 2013-11-12 NOTE — Addendum Note (Signed)
Addended by: Deno EtienneBARKLEY, Kamaree Berkel L on: 11/12/2013 05:21 PM   Modules accepted: Orders

## 2013-11-14 LAB — URINE CULTURE: Colony Count: 9000

## 2013-11-15 ENCOUNTER — Ambulatory Visit (INDEPENDENT_AMBULATORY_CARE_PROVIDER_SITE_OTHER): Payer: Medicare Other

## 2013-11-15 DIAGNOSIS — E041 Nontoxic single thyroid nodule: Secondary | ICD-10-CM

## 2013-11-16 ENCOUNTER — Other Ambulatory Visit: Payer: Self-pay | Admitting: *Deleted

## 2013-11-16 DIAGNOSIS — E041 Nontoxic single thyroid nodule: Secondary | ICD-10-CM

## 2013-11-17 LAB — TSH: TSH: 1.096 u[IU]/mL (ref 0.350–4.500)

## 2013-11-17 LAB — T4, FREE: Free T4: 1.22 ng/dL (ref 0.80–1.80)

## 2013-11-17 LAB — T3, FREE: T3, Free: 3.1 pg/mL (ref 2.3–4.2)

## 2013-12-16 ENCOUNTER — Other Ambulatory Visit: Payer: Self-pay | Admitting: Family Medicine

## 2013-12-20 ENCOUNTER — Other Ambulatory Visit: Payer: Self-pay | Admitting: *Deleted

## 2013-12-20 DIAGNOSIS — Z1231 Encounter for screening mammogram for malignant neoplasm of breast: Secondary | ICD-10-CM

## 2013-12-22 ENCOUNTER — Encounter: Payer: Self-pay | Admitting: Family Medicine

## 2014-01-10 ENCOUNTER — Other Ambulatory Visit: Payer: Self-pay | Admitting: Family Medicine

## 2014-01-13 ENCOUNTER — Other Ambulatory Visit: Payer: Self-pay | Admitting: Family Medicine

## 2014-04-09 ENCOUNTER — Other Ambulatory Visit: Payer: Self-pay | Admitting: Family Medicine

## 2014-05-16 ENCOUNTER — Other Ambulatory Visit: Payer: Self-pay | Admitting: Family Medicine

## 2014-05-17 NOTE — Telephone Encounter (Signed)
Please contact patient. I don't think she comes here anymoe bc of transportation issues

## 2014-05-17 NOTE — Telephone Encounter (Signed)
Dr. Linford Arnold are these meds okay to refill? Corliss Skains, CMA

## 2016-01-17 ENCOUNTER — Ambulatory Visit: Payer: Self-pay | Admitting: Family Medicine

## 2016-05-14 ENCOUNTER — Other Ambulatory Visit: Payer: Self-pay | Admitting: Family Medicine

## 2016-05-14 ENCOUNTER — Ambulatory Visit (INDEPENDENT_AMBULATORY_CARE_PROVIDER_SITE_OTHER): Payer: Medicare Other | Admitting: Family Medicine

## 2016-05-14 ENCOUNTER — Encounter: Payer: Self-pay | Admitting: Family Medicine

## 2016-05-14 VITALS — BP 108/60 | HR 68

## 2016-05-14 DIAGNOSIS — I10 Essential (primary) hypertension: Secondary | ICD-10-CM

## 2016-05-14 DIAGNOSIS — K589 Irritable bowel syndrome without diarrhea: Secondary | ICD-10-CM | POA: Diagnosis not present

## 2016-05-14 DIAGNOSIS — N3281 Overactive bladder: Secondary | ICD-10-CM | POA: Diagnosis not present

## 2016-05-14 DIAGNOSIS — Z9289 Personal history of other medical treatment: Secondary | ICD-10-CM

## 2016-05-14 DIAGNOSIS — G809 Cerebral palsy, unspecified: Secondary | ICD-10-CM

## 2016-05-14 DIAGNOSIS — E559 Vitamin D deficiency, unspecified: Secondary | ICD-10-CM

## 2016-05-14 DIAGNOSIS — Z1231 Encounter for screening mammogram for malignant neoplasm of breast: Secondary | ICD-10-CM

## 2016-05-14 DIAGNOSIS — N390 Urinary tract infection, site not specified: Secondary | ICD-10-CM

## 2016-05-14 MED ORDER — DICYCLOMINE HCL 20 MG PO TABS
20.0000 mg | ORAL_TABLET | Freq: Two times a day (BID) | ORAL | 1 refills | Status: DC | PRN
Start: 2016-05-14 — End: 2016-10-09

## 2016-05-14 MED ORDER — LISINOPRIL-HYDROCHLOROTHIAZIDE 10-12.5 MG PO TABS: 1.0000 | ORAL_TABLET | Freq: Every day | ORAL | 1 refills | Status: DC

## 2016-05-14 MED ORDER — CICLOPIROX 8 % EX SOLN: Freq: Every day | CUTANEOUS | 2 refills | Status: DC

## 2016-05-14 MED ORDER — NITROFURANTOIN MACROCRYSTAL 100 MG PO CAPS: ORAL_CAPSULE | ORAL | 1 refills | Status: DC

## 2016-05-14 MED ORDER — OXYBUTYNIN CHLORIDE 5 MG PO TABS: 5.0000 mg | ORAL_TABLET | Freq: Three times a day (TID) | ORAL | 3 refills | Status: DC

## 2016-05-14 MED ORDER — MUPIROCIN 2 % EX OINT: TOPICAL_OINTMENT | Freq: Two times a day (BID) | CUTANEOUS | 0 refills | Status: DC

## 2016-05-14 NOTE — Progress Notes (Signed)
  Subjective:    CC: HTN  HPI: She was getting home care where her doc would come into the home.  Dr. Sherilyn CooterHenry Trip was witting her script. She was here 2 years ago.  It was also the last time that she had blood work.  Hypertension- Pt denies chest pain, SOB, dizziness, or heart palpitations.  Taking meds as directed w/o problems.  Denies medication side effects.    C/O of OAB. She was diagnosed with this since I last saw her. She is now taking oxybutynin and is also on nitrofurantoin for prophylaxis for recurrent UTIs.  IBS - she is out of Bentyl. Dr. Sherri Radrip was witting her medications.  She would like a refill on the prescription.  Here with a friend today, Mrs. Sawvel.    She also has a yellow cracking nail on the first finger on her left hand. She wants to know what she can do to get better. She says been there for almost a year.  She also has a scabbed sore on the base of her thumb on her right hand. She says it's from the friction of using her crutches.  Past medical history, Surgical history, Family history not pertinant except as noted below, Social history, Allergies, and medications have been entered into the medical record, reviewed, and corrections made.   Review of Systems: No fevers, chills, night sweats, weight loss, chest pain, or shortness of breath.   Objective:    General: Well Developed, well nourished, and in no acute distress.  Neuro: Alert and oriented x3, extra-ocular muscles intact, sensation grossly intact.  HEENT: Normocephalic, atraumatic  Skin: Warm and dry, no rashes.At the base of the thumb on her right hand she has a open scab with some serous drainage. No surrounding erythema to suspect infection. It was likely a blister or abrasion from friction that broke open. Also on her first finger on her left hand she has a yellow broken nail. Cardiac: Regular rate and rhythm, no murmurs rubs or gallops, no lower extremity edema.  Respiratory: Clear to auscultation  bilaterally. Not using accessory muscles, speaking in full sentences.   Impression and Recommendations:   HTN - Well controlled. Continue current regimen. Follow up in  6 mo. Due for CMP and Lipid.    Onychomycosis, left hand-recommend a trial of Penlac. Explained how to remove it properly. If she is not noticing some good healthy nail growing from the base over the next 2 months and please call and consider switching to Lamisil if she has normal liver enzymes.  Open wound on right hand at the base of the thumb-likely a blister initially that ruptured and now has a scab and is healing. Recommend applying mupirocin ointment. Next  Overactive bladder-we'll refill her oxybutynin. Next  Recurrent UTI-refill nitrofurantoin. It's not clear how long she's been on this at some point we will likely need to try her off of it.  Plan will be to schedule her mammogram at her follow-up in 6 months as it's very difficult for him to get out of the house.     Declines flu vaccine today.

## 2016-05-15 ENCOUNTER — Other Ambulatory Visit: Payer: Self-pay | Admitting: Family Medicine

## 2016-05-15 LAB — COMPLETE METABOLIC PANEL WITH GFR
ALK PHOS: 72 U/L (ref 33–130)
ALT: 12 U/L (ref 6–29)
AST: 18 U/L (ref 10–35)
Albumin: 4.3 g/dL (ref 3.6–5.1)
BILIRUBIN TOTAL: 0.5 mg/dL (ref 0.2–1.2)
BUN: 51 mg/dL — ABNORMAL HIGH (ref 7–25)
CALCIUM: 9.4 mg/dL (ref 8.6–10.4)
CO2: 23 mmol/L (ref 20–31)
Chloride: 104 mmol/L (ref 98–110)
Creat: 1.02 mg/dL (ref 0.50–1.05)
GFR, EST AFRICAN AMERICAN: 72 mL/min (ref >=60)
GFR, Est Non African American: 62 mL/min (ref >=60)
GLUCOSE: 123 mg/dL — AB (ref 65–99)
POTASSIUM: 3.7 mmol/L (ref 3.5–5.3)
Sodium: 137 mmol/L (ref 135–146)
TOTAL PROTEIN: 7.5 g/dL (ref 6.1–8.1)

## 2016-05-15 LAB — LIPID PANEL
CHOL/HDL RATIO: 2.5 ratio (ref <=5.0)
CHOLESTEROL: 158 mg/dL (ref 125–200)
HDL: 64 mg/dL (ref >=46)
LDL Cholesterol: 83 mg/dL (ref <130)
TRIGLYCERIDES: 53 mg/dL (ref <150)
VLDL: 11 mg/dL (ref <30)

## 2016-05-15 LAB — VITAMIN D 25 HYDROXY (VIT D DEFICIENCY, FRACTURES): VIT D 25 HYDROXY: 14 ng/mL — AB (ref 30–100)

## 2016-05-15 MED ORDER — LISINOPRIL 20 MG PO TABS: 20.0000 mg | ORAL_TABLET | Freq: Every day | ORAL | 1 refills | Status: DC

## 2016-05-22 ENCOUNTER — Telehealth: Payer: Self-pay

## 2016-05-22 MED ORDER — VITAMIN D 50 MCG (2000 UT) PO TABS: 2000.0000 [IU] | ORAL_TABLET | Freq: Every day | ORAL | 3 refills | Status: DC

## 2016-05-22 MED ORDER — CYANOCOBALAMIN 500 MCG PO TABS: 500.0000 ug | ORAL_TABLET | Freq: Every day | ORAL | 3 refills | Status: DC

## 2016-05-22 NOTE — Telephone Encounter (Signed)
Rx's sent to Pt's preferred pharmacy. Pt advised.

## 2016-05-22 NOTE — Telephone Encounter (Signed)
Ok to refill vitamin D and cyancobolamin. I didn't realize she one of the three fell when she was here.

## 2016-05-22 NOTE — Telephone Encounter (Signed)
Pt called looking for vit D, vit B, and bactroban Rx.  She received all other meds that was ordered on 05/14/16 through her mail order.  I told her that I can resend the [place]. She said that you had Rx her vit D and vit B before.  I see that the vit D is historical entry (is that the correct dose?) and vit B is not on her current or past med list. Please advise.

## 2016-06-08 ENCOUNTER — Other Ambulatory Visit: Payer: Self-pay | Admitting: Family Medicine

## 2016-06-26 ENCOUNTER — Telehealth: Payer: Self-pay | Admitting: *Deleted

## 2016-06-26 MED ORDER — MUPIROCIN 2 % EX OINT: TOPICAL_OINTMENT | CUTANEOUS | 0 refills | Status: DC

## 2016-06-26 NOTE — Telephone Encounter (Signed)
Patient request that bactroban be sent to her pharm. A rx for this was sent on 06/10/16. I advised her that should have lasted her the month. I asked patient if her thumb was getting better and she states it looks much better but she just didn't want it to flare up again. She states the area come from the friction from her crutches. I exlplained to her that her insurance may not pay for this medication since it has not been a month. Also advised her that in order to reduce the friction she could also try to use vasoline on the area that it rubs or wrap it. Patient wanted the bactroban sent and understood that she may have to pay out of pocket for it. Advised that if the area started to come back and didn't look any better then she would need to come back in to be seen. She wanted the rx sent to [facility] [place]

## 2016-07-11 ENCOUNTER — Other Ambulatory Visit: Payer: Self-pay

## 2016-07-11 MED ORDER — CICLOPIROX 8 % EX SOLN: Freq: Every day | CUTANEOUS | 2 refills | Status: DC

## 2016-09-02 ENCOUNTER — Other Ambulatory Visit: Payer: Self-pay | Admitting: Family Medicine

## 2016-10-09 ENCOUNTER — Other Ambulatory Visit: Payer: Self-pay | Admitting: Family Medicine

## 2016-10-09 DIAGNOSIS — K589 Irritable bowel syndrome without diarrhea: Secondary | ICD-10-CM

## 2016-10-09 DIAGNOSIS — N390 Urinary tract infection, site not specified: Secondary | ICD-10-CM

## 2016-10-10 ENCOUNTER — Other Ambulatory Visit: Payer: Self-pay | Admitting: *Deleted

## 2016-10-10 MED ORDER — CICLOPIROX 8 % EX SOLN: Freq: Every day | CUTANEOUS | 2 refills | Status: DC

## 2016-11-15 ENCOUNTER — Ambulatory Visit: Payer: Medicare Other | Admitting: Family Medicine

## 2016-11-15 ENCOUNTER — Ambulatory Visit: Payer: Medicare Other

## 2016-12-11 ENCOUNTER — Ambulatory Visit (INDEPENDENT_AMBULATORY_CARE_PROVIDER_SITE_OTHER): Payer: Medicare Other

## 2016-12-11 ENCOUNTER — Ambulatory Visit (INDEPENDENT_AMBULATORY_CARE_PROVIDER_SITE_OTHER): Payer: Medicare Other | Admitting: Family Medicine

## 2016-12-11 ENCOUNTER — Encounter: Payer: Self-pay | Admitting: Family Medicine

## 2016-12-11 VITALS — BP 113/66 | HR 53

## 2016-12-11 DIAGNOSIS — N3281 Overactive bladder: Secondary | ICD-10-CM | POA: Diagnosis not present

## 2016-12-11 DIAGNOSIS — R7309 Other abnormal glucose: Secondary | ICD-10-CM | POA: Diagnosis not present

## 2016-12-11 DIAGNOSIS — Z1231 Encounter for screening mammogram for malignant neoplasm of breast: Secondary | ICD-10-CM | POA: Diagnosis not present

## 2016-12-11 DIAGNOSIS — I10 Essential (primary) hypertension: Secondary | ICD-10-CM | POA: Diagnosis not present

## 2016-12-11 DIAGNOSIS — E559 Vitamin D deficiency, unspecified: Secondary | ICD-10-CM

## 2016-12-11 LAB — POCT GLYCOSYLATED HEMOGLOBIN (HGB A1C): HEMOGLOBIN A1C: 5.1
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
## 2016-12-12 LAB — BASIC METABOLIC PANEL WITH GFR
BUN: 20 mg/dL (ref 7–25)
CHLORIDE: 106 mmol/L (ref 98–110)
CO2: 22 mmol/L (ref 20–31)
Calcium: 9.1 mg/dL (ref 8.6–10.4)
Creat: 0.61 mg/dL (ref 0.50–1.05)
Glucose, Bld: 80 mg/dL (ref 65–99)
POTASSIUM: 4 mmol/L (ref 3.5–5.3)
Sodium: 138 mmol/L (ref 135–146)

## 2016-12-12 NOTE — Progress Notes (Signed)
All labs are normal. 

## 2016-12-18 ENCOUNTER — Ambulatory Visit: Payer: Medicare Other

## 2016-12-30 ENCOUNTER — Telehealth: Payer: Self-pay | Admitting: Family Medicine

## 2016-12-30 DIAGNOSIS — K589 Irritable bowel syndrome without diarrhea: Secondary | ICD-10-CM

## 2016-12-30 DIAGNOSIS — N3281 Overactive bladder: Secondary | ICD-10-CM
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
## 2017-01-01 ENCOUNTER — Telehealth: Payer: Self-pay | Admitting: Family Medicine

## 2017-01-01 DIAGNOSIS — E441 Mild protein-calorie malnutrition: Secondary | ICD-10-CM

## 2017-01-01 MED ORDER — DICYCLOMINE HCL 20 MG PO TABS: 20.0000 mg | ORAL_TABLET | Freq: Two times a day (BID) | ORAL | 0 refills | Status: DC | PRN

## 2017-01-01 MED ORDER — LISINOPRIL 20 MG PO TABS: 20.0000 mg | ORAL_TABLET | Freq: Every day | ORAL | 0 refills | Status: DC

## 2017-01-01 MED ORDER — MUPIROCIN 2 % EX OINT: TOPICAL_OINTMENT | CUTANEOUS | 3 refills | Status: DC

## 2017-01-01 MED ORDER — OXYBUTYNIN CHLORIDE 5 MG PO TABS: 5.0000 mg | ORAL_TABLET | Freq: Three times a day (TID) | ORAL | 0 refills | Status: DC

## 2017-01-01 NOTE — Addendum Note (Signed)
Addended by: Deno EtienneBARKLEY, Milessa Hogan L on: 01/01/2017 04:51 PM   Modules accepted: Orders

## 2017-01-01 NOTE — Telephone Encounter (Signed)
Pt would like an order for personal care services (to get a home aid) now that she has Medicaid due to her cerebral palsy.   Medicaid ID: [id number]

## 2017-01-01 NOTE — Telephone Encounter (Signed)
OK to place order.

## 2017-01-01 NOTE — Telephone Encounter (Signed)
vm was left asking that [facility]. Refills sent.Loralee PacasBarkley, Kaimen Peine [place]

## 2017-01-02 ENCOUNTER — Telehealth: Payer: Self-pay

## 2017-01-02 NOTE — Telephone Encounter (Signed)
Order placed

## 2017-01-07 ENCOUNTER — Telehealth: Payer: Self-pay | Admitting: Family Medicine

## 2017-01-07 NOTE — Telephone Encounter (Signed)
Dr. Linford ArnoldMetheney   I received an e mail from Weston Brassick our representative for [facility] stating that patient is refusing services for her unsteady balance and does not want anyone coming to her home. - CF

## 2017-01-08 NOTE — Telephone Encounter (Signed)
Called and spoke with Pt regarding PT refusal. Pt states she has "done PT all her life and doesn't want to do it anymore." Pt does not think PT will help her enough to make a difference. States she does [facility] for other resources, just not the PT. Went into great detail on the benefits of PT and how it could help her, she still refused services.

## 2017-01-08 NOTE — Telephone Encounter (Signed)
Riley ChurchesKelsi, can you try to call this patient and talk with her.

## 2017-02-12 ENCOUNTER — Other Ambulatory Visit: Payer: Self-pay | Admitting: *Deleted

## 2017-02-12 DIAGNOSIS — K589 Irritable bowel syndrome without diarrhea: Secondary | ICD-10-CM

## 2017-02-12 DIAGNOSIS — N3281 Overactive bladder: Secondary | ICD-10-CM

## 2017-02-12 MED ORDER — OXYBUTYNIN CHLORIDE 5 MG PO TABS: 5.0000 mg | ORAL_TABLET | Freq: Three times a day (TID) | ORAL | 1 refills | Status: DC

## 2017-02-12 MED ORDER — DICYCLOMINE HCL 20 MG PO TABS: 20.0000 mg | ORAL_TABLET | Freq: Two times a day (BID) | ORAL | 1 refills | Status: DC | PRN

## 2017-04-04 DIAGNOSIS — Z792 Long term (current) use of antibiotics: Secondary | ICD-10-CM | POA: Diagnosis not present

## 2017-04-04 DIAGNOSIS — R1313 Dysphagia, pharyngeal phase: Secondary | ICD-10-CM | POA: Diagnosis not present

## 2017-04-04 DIAGNOSIS — Z9181 History of falling: Secondary | ICD-10-CM | POA: Diagnosis not present

## 2017-04-04 DIAGNOSIS — E441 Mild protein-calorie malnutrition: Secondary | ICD-10-CM | POA: Diagnosis not present

## 2017-04-04 DIAGNOSIS — N390 Urinary tract infection, site not specified: Secondary | ICD-10-CM | POA: Diagnosis not present

## 2017-04-04 DIAGNOSIS — E559 Vitamin D deficiency, unspecified: Secondary | ICD-10-CM | POA: Diagnosis not present

## 2017-04-04 DIAGNOSIS — K589 Irritable bowel syndrome without diarrhea: Secondary | ICD-10-CM | POA: Diagnosis not present

## 2017-04-04 DIAGNOSIS — N3281 Overactive bladder: Secondary | ICD-10-CM | POA: Diagnosis not present

## 2017-04-04 DIAGNOSIS — G809 Cerebral palsy, unspecified: Secondary | ICD-10-CM | POA: Diagnosis not present

## 2017-04-04 DIAGNOSIS — I10 Essential (primary) hypertension: Secondary | ICD-10-CM | POA: Diagnosis not present

## 2017-04-06 ENCOUNTER — Other Ambulatory Visit: Payer: Self-pay | Admitting: Family Medicine

## 2017-04-08 DIAGNOSIS — E559 Vitamin D deficiency, unspecified: Secondary | ICD-10-CM | POA: Diagnosis not present

## 2017-04-08 DIAGNOSIS — Z9181 History of falling: Secondary | ICD-10-CM | POA: Diagnosis not present

## 2017-04-08 DIAGNOSIS — R1313 Dysphagia, pharyngeal phase: Secondary | ICD-10-CM | POA: Diagnosis not present

## 2017-04-08 DIAGNOSIS — N3281 Overactive bladder: Secondary | ICD-10-CM | POA: Diagnosis not present

## 2017-04-08 DIAGNOSIS — K589 Irritable bowel syndrome without diarrhea: Secondary | ICD-10-CM | POA: Diagnosis not present

## 2017-04-08 DIAGNOSIS — G809 Cerebral palsy, unspecified: Secondary | ICD-10-CM | POA: Diagnosis not present

## 2017-04-08 DIAGNOSIS — Z792 Long term (current) use of antibiotics: Secondary | ICD-10-CM | POA: Diagnosis not present

## 2017-04-08 DIAGNOSIS — N390 Urinary tract infection, site not specified: Secondary | ICD-10-CM | POA: Diagnosis not present

## 2017-04-08 DIAGNOSIS — I10 Essential (primary) hypertension: Secondary | ICD-10-CM | POA: Diagnosis not present

## 2017-04-08 DIAGNOSIS — E441 Mild protein-calorie malnutrition: Secondary | ICD-10-CM | POA: Diagnosis not present

## 2017-04-17 DIAGNOSIS — I10 Essential (primary) hypertension: Secondary | ICD-10-CM | POA: Diagnosis not present

## 2017-04-17 DIAGNOSIS — N390 Urinary tract infection, site not specified: Secondary | ICD-10-CM | POA: Diagnosis not present

## 2017-04-17 DIAGNOSIS — G809 Cerebral palsy, unspecified: Secondary | ICD-10-CM | POA: Diagnosis not present

## 2017-04-17 DIAGNOSIS — E559 Vitamin D deficiency, unspecified: Secondary | ICD-10-CM | POA: Diagnosis not present

## 2017-04-17 DIAGNOSIS — R1313 Dysphagia, pharyngeal phase: Secondary | ICD-10-CM | POA: Diagnosis not present

## 2017-04-17 DIAGNOSIS — Z792 Long term (current) use of antibiotics: Secondary | ICD-10-CM | POA: Diagnosis not present

## 2017-04-17 DIAGNOSIS — K589 Irritable bowel syndrome without diarrhea: Secondary | ICD-10-CM | POA: Diagnosis not present

## 2017-04-17 DIAGNOSIS — E441 Mild protein-calorie malnutrition: Secondary | ICD-10-CM | POA: Diagnosis not present

## 2017-04-17 DIAGNOSIS — Z9181 History of falling: Secondary | ICD-10-CM | POA: Diagnosis not present

## 2017-04-17 DIAGNOSIS — N3281 Overactive bladder: Secondary | ICD-10-CM | POA: Diagnosis not present

## 2017-04-25 DIAGNOSIS — E441 Mild protein-calorie malnutrition: Secondary | ICD-10-CM | POA: Diagnosis not present

## 2017-04-25 DIAGNOSIS — R1313 Dysphagia, pharyngeal phase: Secondary | ICD-10-CM | POA: Diagnosis not present

## 2017-04-25 DIAGNOSIS — Z9181 History of falling: Secondary | ICD-10-CM | POA: Diagnosis not present

## 2017-04-25 DIAGNOSIS — N3281 Overactive bladder: Secondary | ICD-10-CM | POA: Diagnosis not present

## 2017-04-25 DIAGNOSIS — I10 Essential (primary) hypertension: Secondary | ICD-10-CM | POA: Diagnosis not present

## 2017-04-25 DIAGNOSIS — K589 Irritable bowel syndrome without diarrhea: Secondary | ICD-10-CM | POA: Diagnosis not present

## 2017-04-25 DIAGNOSIS — G809 Cerebral palsy, unspecified: Secondary | ICD-10-CM | POA: Diagnosis not present

## 2017-04-25 DIAGNOSIS — Z792 Long term (current) use of antibiotics: Secondary | ICD-10-CM | POA: Diagnosis not present

## 2017-04-25 DIAGNOSIS — N390 Urinary tract infection, site not specified: Secondary | ICD-10-CM | POA: Diagnosis not present

## 2017-04-25 DIAGNOSIS — E559 Vitamin D deficiency, unspecified: Secondary | ICD-10-CM | POA: Diagnosis not present

## 2017-04-29 DIAGNOSIS — E559 Vitamin D deficiency, unspecified: Secondary | ICD-10-CM | POA: Diagnosis not present

## 2017-04-29 DIAGNOSIS — Z792 Long term (current) use of antibiotics: Secondary | ICD-10-CM | POA: Diagnosis not present

## 2017-04-29 DIAGNOSIS — Z9181 History of falling: Secondary | ICD-10-CM | POA: Diagnosis not present

## 2017-04-29 DIAGNOSIS — G809 Cerebral palsy, unspecified: Secondary | ICD-10-CM | POA: Diagnosis not present

## 2017-04-29 DIAGNOSIS — N390 Urinary tract infection, site not specified: Secondary | ICD-10-CM | POA: Diagnosis not present

## 2017-04-29 DIAGNOSIS — E441 Mild protein-calorie malnutrition: Secondary | ICD-10-CM | POA: Diagnosis not present

## 2017-04-29 DIAGNOSIS — N3281 Overactive bladder: Secondary | ICD-10-CM | POA: Diagnosis not present

## 2017-04-29 DIAGNOSIS — K589 Irritable bowel syndrome without diarrhea: Secondary | ICD-10-CM | POA: Diagnosis not present

## 2017-04-29 DIAGNOSIS — I10 Essential (primary) hypertension: Secondary | ICD-10-CM | POA: Diagnosis not present

## 2017-04-29 DIAGNOSIS — R1313 Dysphagia, pharyngeal phase: Secondary | ICD-10-CM | POA: Diagnosis not present

## 2017-05-08 ENCOUNTER — Other Ambulatory Visit: Payer: Self-pay | Admitting: Family Medicine

## 2017-05-08 DIAGNOSIS — N390 Urinary tract infection, site not specified: Secondary | ICD-10-CM

## 2017-05-09 DIAGNOSIS — E441 Mild protein-calorie malnutrition: Secondary | ICD-10-CM | POA: Diagnosis not present

## 2017-05-09 DIAGNOSIS — N3281 Overactive bladder: Secondary | ICD-10-CM | POA: Diagnosis not present

## 2017-05-09 DIAGNOSIS — Z9181 History of falling: Secondary | ICD-10-CM | POA: Diagnosis not present

## 2017-05-09 DIAGNOSIS — R1313 Dysphagia, pharyngeal phase: Secondary | ICD-10-CM | POA: Diagnosis not present

## 2017-05-09 DIAGNOSIS — Z792 Long term (current) use of antibiotics: Secondary | ICD-10-CM | POA: Diagnosis not present

## 2017-05-09 DIAGNOSIS — I10 Essential (primary) hypertension: Secondary | ICD-10-CM | POA: Diagnosis not present

## 2017-05-09 DIAGNOSIS — E559 Vitamin D deficiency, unspecified: Secondary | ICD-10-CM | POA: Diagnosis not present

## 2017-05-09 DIAGNOSIS — N390 Urinary tract infection, site not specified: Secondary | ICD-10-CM | POA: Diagnosis not present

## 2017-05-09 DIAGNOSIS — K589 Irritable bowel syndrome without diarrhea: Secondary | ICD-10-CM | POA: Diagnosis not present

## 2017-05-09 DIAGNOSIS — G809 Cerebral palsy, unspecified: Secondary | ICD-10-CM | POA: Diagnosis not present

## 2017-05-12 ENCOUNTER — Other Ambulatory Visit: Payer: Self-pay | Admitting: *Deleted

## 2017-05-12 DIAGNOSIS — N390 Urinary tract infection, site not specified: Secondary | ICD-10-CM

## 2017-05-12 MED ORDER — NITROFURANTOIN MACROCRYSTAL 100 MG PO CAPS: 100.0000 mg | ORAL_CAPSULE | Freq: Every day | ORAL | 0 refills | Status: DC

## 2017-05-14 ENCOUNTER — Telehealth: Payer: Self-pay

## 2017-05-14 DIAGNOSIS — G809 Cerebral palsy, unspecified: Secondary | ICD-10-CM | POA: Diagnosis not present

## 2017-05-14 DIAGNOSIS — E559 Vitamin D deficiency, unspecified: Secondary | ICD-10-CM | POA: Diagnosis not present

## 2017-05-14 DIAGNOSIS — I10 Essential (primary) hypertension: Secondary | ICD-10-CM | POA: Diagnosis not present

## 2017-05-14 DIAGNOSIS — N390 Urinary tract infection, site not specified: Secondary | ICD-10-CM | POA: Diagnosis not present

## 2017-05-14 DIAGNOSIS — E441 Mild protein-calorie malnutrition: Secondary | ICD-10-CM | POA: Diagnosis not present

## 2017-05-14 DIAGNOSIS — Z9181 History of falling: Secondary | ICD-10-CM | POA: Diagnosis not present

## 2017-05-14 DIAGNOSIS — K589 Irritable bowel syndrome without diarrhea: Secondary | ICD-10-CM | POA: Diagnosis not present

## 2017-05-14 DIAGNOSIS — Z792 Long term (current) use of antibiotics: Secondary | ICD-10-CM | POA: Diagnosis not present

## 2017-05-14 DIAGNOSIS — N3281 Overactive bladder: Secondary | ICD-10-CM | POA: Diagnosis not present

## 2017-05-14 DIAGNOSIS — R1313 Dysphagia, pharyngeal phase: Secondary | ICD-10-CM | POA: Diagnosis not present
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
## 2017-05-14 NOTE — Telephone Encounter (Signed)
Please give her verbal to get socal worker involved.  I agree sounds like she would be much better off in a facility.

## 2017-05-15 NOTE — Telephone Encounter (Signed)
Verbal order given to tiffany.

## 2017-05-16 DIAGNOSIS — E441 Mild protein-calorie malnutrition: Secondary | ICD-10-CM | POA: Diagnosis not present

## 2017-05-16 DIAGNOSIS — N3281 Overactive bladder: Secondary | ICD-10-CM | POA: Diagnosis not present

## 2017-05-16 DIAGNOSIS — K589 Irritable bowel syndrome without diarrhea: Secondary | ICD-10-CM | POA: Diagnosis not present

## 2017-05-16 DIAGNOSIS — Z9181 History of falling: Secondary | ICD-10-CM | POA: Diagnosis not present

## 2017-05-16 DIAGNOSIS — R1313 Dysphagia, pharyngeal phase: Secondary | ICD-10-CM | POA: Diagnosis not present

## 2017-05-16 DIAGNOSIS — I10 Essential (primary) hypertension: Secondary | ICD-10-CM | POA: Diagnosis not present

## 2017-05-16 DIAGNOSIS — Z792 Long term (current) use of antibiotics: Secondary | ICD-10-CM | POA: Diagnosis not present

## 2017-05-16 DIAGNOSIS — E559 Vitamin D deficiency, unspecified: Secondary | ICD-10-CM | POA: Diagnosis not present

## 2017-05-16 DIAGNOSIS — G809 Cerebral palsy, unspecified: Secondary | ICD-10-CM | POA: Diagnosis not present

## 2017-05-16 DIAGNOSIS — N390 Urinary tract infection, site not specified: Secondary | ICD-10-CM | POA: Diagnosis not present

## 2017-05-20 DIAGNOSIS — N3281 Overactive bladder: Secondary | ICD-10-CM | POA: Diagnosis not present

## 2017-05-20 DIAGNOSIS — Z87440 Personal history of urinary (tract) infections: Secondary | ICD-10-CM | POA: Diagnosis not present

## 2017-05-20 DIAGNOSIS — K589 Irritable bowel syndrome without diarrhea: Secondary | ICD-10-CM | POA: Diagnosis not present

## 2017-05-20 DIAGNOSIS — E559 Vitamin D deficiency, unspecified: Secondary | ICD-10-CM | POA: Diagnosis not present

## 2017-05-20 DIAGNOSIS — R1313 Dysphagia, pharyngeal phase: Secondary | ICD-10-CM | POA: Diagnosis not present

## 2017-05-20 DIAGNOSIS — M79672 Pain in left foot: Secondary | ICD-10-CM | POA: Diagnosis not present

## 2017-05-20 DIAGNOSIS — Z9181 History of falling: Secondary | ICD-10-CM | POA: Diagnosis not present

## 2017-05-20 DIAGNOSIS — E441 Mild protein-calorie malnutrition: Secondary | ICD-10-CM | POA: Diagnosis not present

## 2017-05-20 DIAGNOSIS — G809 Cerebral palsy, unspecified: Secondary | ICD-10-CM | POA: Diagnosis not present

## 2017-05-20 DIAGNOSIS — I10 Essential (primary) hypertension: Secondary | ICD-10-CM | POA: Diagnosis not present

## 2017-05-22 DIAGNOSIS — R1313 Dysphagia, pharyngeal phase: Secondary | ICD-10-CM | POA: Diagnosis not present

## 2017-05-22 DIAGNOSIS — E441 Mild protein-calorie malnutrition: Secondary | ICD-10-CM | POA: Diagnosis not present

## 2017-05-22 DIAGNOSIS — N3281 Overactive bladder: Secondary | ICD-10-CM | POA: Diagnosis not present

## 2017-05-22 DIAGNOSIS — K589 Irritable bowel syndrome without diarrhea: Secondary | ICD-10-CM | POA: Diagnosis not present

## 2017-05-22 DIAGNOSIS — Z9181 History of falling: Secondary | ICD-10-CM | POA: Diagnosis not present

## 2017-05-22 DIAGNOSIS — Z87440 Personal history of urinary (tract) infections: Secondary | ICD-10-CM | POA: Diagnosis not present

## 2017-05-22 DIAGNOSIS — M79672 Pain in left foot: Secondary | ICD-10-CM | POA: Diagnosis not present

## 2017-05-22 DIAGNOSIS — G809 Cerebral palsy, unspecified: Secondary | ICD-10-CM | POA: Diagnosis not present

## 2017-05-22 DIAGNOSIS — I10 Essential (primary) hypertension: Secondary | ICD-10-CM | POA: Diagnosis not present

## 2017-05-22 DIAGNOSIS — E559 Vitamin D deficiency, unspecified: Secondary | ICD-10-CM | POA: Diagnosis not present

## 2017-05-27 DIAGNOSIS — I10 Essential (primary) hypertension: Secondary | ICD-10-CM | POA: Diagnosis not present

## 2017-05-27 DIAGNOSIS — E559 Vitamin D deficiency, unspecified: Secondary | ICD-10-CM | POA: Diagnosis not present

## 2017-05-27 DIAGNOSIS — E441 Mild protein-calorie malnutrition: Secondary | ICD-10-CM | POA: Diagnosis not present

## 2017-05-27 DIAGNOSIS — R1313 Dysphagia, pharyngeal phase: Secondary | ICD-10-CM | POA: Diagnosis not present

## 2017-05-27 DIAGNOSIS — M79672 Pain in left foot: Secondary | ICD-10-CM | POA: Diagnosis not present

## 2017-05-27 DIAGNOSIS — G809 Cerebral palsy, unspecified: Secondary | ICD-10-CM | POA: Diagnosis not present

## 2017-05-27 DIAGNOSIS — Z87440 Personal history of urinary (tract) infections: Secondary | ICD-10-CM | POA: Diagnosis not present

## 2017-05-27 DIAGNOSIS — Z9181 History of falling: Secondary | ICD-10-CM | POA: Diagnosis not present

## 2017-05-27 DIAGNOSIS — N3281 Overactive bladder: Secondary | ICD-10-CM | POA: Diagnosis not present

## 2017-05-27 DIAGNOSIS — K589 Irritable bowel syndrome without diarrhea: Secondary | ICD-10-CM | POA: Diagnosis not present

## 2017-05-29 DIAGNOSIS — E441 Mild protein-calorie malnutrition: Secondary | ICD-10-CM | POA: Diagnosis not present

## 2017-05-29 DIAGNOSIS — K589 Irritable bowel syndrome without diarrhea: Secondary | ICD-10-CM | POA: Diagnosis not present

## 2017-05-29 DIAGNOSIS — Z87440 Personal history of urinary (tract) infections: Secondary | ICD-10-CM | POA: Diagnosis not present

## 2017-05-29 DIAGNOSIS — G809 Cerebral palsy, unspecified: Secondary | ICD-10-CM | POA: Diagnosis not present

## 2017-05-29 DIAGNOSIS — E559 Vitamin D deficiency, unspecified: Secondary | ICD-10-CM | POA: Diagnosis not present

## 2017-05-29 DIAGNOSIS — N3281 Overactive bladder: Secondary | ICD-10-CM | POA: Diagnosis not present

## 2017-05-29 DIAGNOSIS — R1313 Dysphagia, pharyngeal phase: Secondary | ICD-10-CM | POA: Diagnosis not present

## 2017-05-29 DIAGNOSIS — I10 Essential (primary) hypertension: Secondary | ICD-10-CM | POA: Diagnosis not present

## 2017-05-29 DIAGNOSIS — M79672 Pain in left foot: Secondary | ICD-10-CM | POA: Diagnosis not present

## 2017-05-29 DIAGNOSIS — Z9181 History of falling: Secondary | ICD-10-CM | POA: Diagnosis not present

## 2017-06-03 ENCOUNTER — Other Ambulatory Visit: Payer: Self-pay | Admitting: Family Medicine

## 2017-06-04 DIAGNOSIS — K589 Irritable bowel syndrome without diarrhea: Secondary | ICD-10-CM | POA: Diagnosis not present

## 2017-06-04 DIAGNOSIS — R1313 Dysphagia, pharyngeal phase: Secondary | ICD-10-CM | POA: Diagnosis not present

## 2017-06-04 DIAGNOSIS — G809 Cerebral palsy, unspecified: Secondary | ICD-10-CM | POA: Diagnosis not present

## 2017-06-04 DIAGNOSIS — E559 Vitamin D deficiency, unspecified: Secondary | ICD-10-CM | POA: Diagnosis not present

## 2017-06-04 DIAGNOSIS — E441 Mild protein-calorie malnutrition: Secondary | ICD-10-CM | POA: Diagnosis not present

## 2017-06-04 DIAGNOSIS — Z87440 Personal history of urinary (tract) infections: Secondary | ICD-10-CM | POA: Diagnosis not present

## 2017-06-04 DIAGNOSIS — M79672 Pain in left foot: Secondary | ICD-10-CM | POA: Diagnosis not present

## 2017-06-04 DIAGNOSIS — N3281 Overactive bladder: Secondary | ICD-10-CM | POA: Diagnosis not present

## 2017-06-04 DIAGNOSIS — Z9181 History of falling: Secondary | ICD-10-CM | POA: Diagnosis not present

## 2017-06-04 DIAGNOSIS — I10 Essential (primary) hypertension: Secondary | ICD-10-CM | POA: Diagnosis not present

## 2017-06-18 ENCOUNTER — Telehealth: Payer: Self-pay | Admitting: Family Medicine

## 2017-06-18 ENCOUNTER — Encounter: Payer: Self-pay | Admitting: Family Medicine

## 2017-06-18 ENCOUNTER — Ambulatory Visit (INDEPENDENT_AMBULATORY_CARE_PROVIDER_SITE_OTHER): Payer: Medicare Other | Admitting: Family Medicine

## 2017-06-18 VITALS — BP 122/77 | HR 71

## 2017-06-18 DIAGNOSIS — E559 Vitamin D deficiency, unspecified: Secondary | ICD-10-CM | POA: Diagnosis not present

## 2017-06-18 DIAGNOSIS — T7401XA Adult neglect or abandonment, confirmed, initial encounter: Secondary | ICD-10-CM | POA: Diagnosis not present

## 2017-06-18 DIAGNOSIS — E440 Moderate protein-calorie malnutrition: Secondary | ICD-10-CM

## 2017-06-18 DIAGNOSIS — I10 Essential (primary) hypertension: Secondary | ICD-10-CM | POA: Diagnosis not present

## 2017-06-18 DIAGNOSIS — N3281 Overactive bladder: Secondary | ICD-10-CM

## 2017-06-18 MED ORDER — OXYBUTYNIN CHLORIDE 5 MG PO TABS: 5.0000 mg | ORAL_TABLET | Freq: Three times a day (TID) | ORAL | 1 refills | Status: DC

## 2017-06-18 MED ORDER — LISINOPRIL 20 MG PO TABS: 20.0000 mg | ORAL_TABLET | Freq: Every day | ORAL | 1 refills | Status: DC

## 2017-06-18 NOTE — Telephone Encounter (Signed)
[facility].  They had reached out to this last month, please see phone note about getting a Child psychotherapistsocial worker involved because patient is not safe to be in her current environment.  I 100% agree that I do not think she is getting adequate care and/or food and nutrition access.  I have not heard back from them on an update.  They were supposed to get a Child psychotherapistsocial worker involved.

## 2017-06-18 NOTE — Telephone Encounter (Signed)
Left VM for Tiffany ([facility]:[phone]) to return clinic call regarding status update.

## 2017-06-18 NOTE — Telephone Encounter (Signed)
Spoke with [facility] Rep, they are going to look into this and provide an update.

## 2017-06-18 NOTE — Patient Instructions (Signed)
Please try to apply food stamps.

## 2017-06-18 NOTE — Progress Notes (Signed)
Subjective:    CC: HTN  HPI:  Hypertension- Pt denies chest pain, SOB, dizziness, or heart palpitations.  Taking meds as directed w/o problems.  Denies medication side effects.    F/U OAB - she does well as long as she takes her ditropan.  She does need refills on her medication.   Malnourished -sister is here with her today.  Her sister comes and checks on her about once a week.  She is currently living with her brother who works during the day.  He comes home in the evenings and brings an evening meal but during the day does not really have any food that successful in the home for her to eat so she is really only eating once a day.  I had actually involved social work when she was here previously.  Because I was concerned that she was not able to have access to adequate food.  She also really needs some assistance with personal care services.  We were able to get home health to come out for short period of time though initially she had denied them entry into the home.  She says that a nurse [facility] does come out every couple of months to check on her.  She now has Medicare and Medicaid.  Past medical history, Surgical history, Family history not pertinant except as noted below, Social history, Allergies, and medications have been entered into the medical record, reviewed, and corrections made.   Review of Systems: No fevers, chills, night sweats, weight loss, chest pain, or shortness of breath.   Objective:    General: Well Developed, well nourished, and in no acute distress.  Neuro: Alert and oriented x3, extra-ocular muscles intact, sensation grossly intact.  HEENT: Normocephalic, atraumatic  Skin: Warm and dry, no rashes. Cardiac: Regular rate and rhythm, no murmurs rubs or gallops, no lower extremity edema.  Respiratory: Clear to auscultation bilaterally. Not using accessory muscles, speaking in full sentences.   Impression and Recommendations:    HTN - Well  controlled. Continue current regimen. Follow up in  6 months.  Due for labs.  She is due for lab work.  Lab slip printed and given today.  OAB -we will refill medications for the next 6 months.  Vitamin deficiency-due to recheck levels.  Malnutrition-we discussed strategies around getting more food in the home.  I think she would probably qualify for food stamps.  Made her sister can be of assistance to help her with this.  Even small amounts of food that does not require preparations such as cereal, sandwiches, granola bars fruit cups etc. that would be accessible are relatively in expensive.  I really do not feel like she is in the best or most safe or clean environment with access to food.  Neglect -back out to [facility] to see what they have recommended after social work consult was supposed to have been ordered.

## 2017-06-20 LAB — COMPLETE METABOLIC PANEL WITH GFR
AG RATIO: 1.1 (calc) (ref 1.0–2.5)
ALT: 17 U/L (ref 6–29)
AST: 19 U/L (ref 10–35)
Albumin: 3.9 g/dL (ref 3.6–5.1)
Alkaline phosphatase (APISO): 129 U/L (ref 33–130)
BILIRUBIN TOTAL: 0.3 mg/dL (ref 0.2–1.2)
BUN/Creatinine Ratio: 43 (calc) — ABNORMAL HIGH (ref 6–22)
BUN: 37 mg/dL — AB (ref 7–25)
CALCIUM: 9.3 mg/dL (ref 8.6–10.4)
CHLORIDE: 104 mmol/L (ref 98–110)
CO2: 27 mmol/L (ref 20–32)
Creat: 0.86 mg/dL (ref 0.50–1.05)
GFR, EST AFRICAN AMERICAN: 88 mL/min/{1.73_m2} (ref >=60)
GFR, Est Non African American: 76 mL/min/{1.73_m2} (ref >=60)
GLUCOSE: 113 mg/dL — AB (ref 65–99)
Globulin: 3.5 g/dL (calc) (ref 1.9–3.7)
POTASSIUM: 4.3 mmol/L (ref 3.5–5.3)
Sodium: 138 mmol/L (ref 135–146)
TOTAL PROTEIN: 7.4 g/dL (ref 6.1–8.1)

## 2017-06-20 LAB — LIPID PANEL W/REFLEX DIRECT LDL
CHOL/HDL RATIO: 2.4 (calc) (ref <5.0)
Cholesterol: 137 mg/dL (ref <200)
HDL: 57 mg/dL (ref >50)
LDL Cholesterol (Calc): 64 mg/dL (calc)
NON-HDL CHOLESTEROL (CALC): 80 mg/dL (ref <130)
TRIGLYCERIDES: 82 mg/dL (ref <150)

## 2017-06-20 LAB — TEST AUTHORIZATION

## 2017-06-20 LAB — T4, FREE: Free T4: 1.7 ng/dL (ref 0.8–1.8)

## 2017-06-20 LAB — VITAMIN D 25 HYDROXY (VIT D DEFICIENCY, FRACTURES): VIT D 25 HYDROXY: 20 ng/mL — AB (ref 30–100)

## 2017-06-20 LAB — TSH: TSH: 0.01 mIU/L — ABNORMAL LOW (ref 0.40–4.50)

## 2017-07-04 ENCOUNTER — Telehealth: Payer: Self-pay | Admitting: Family Medicine

## 2017-07-04 DIAGNOSIS — E441 Mild protein-calorie malnutrition: Secondary | ICD-10-CM

## 2017-07-04 NOTE — Telephone Encounter (Signed)
Weston Brassick from [facility] called and stated that patient refused last 2 visits for recertification and they will need a new referral due to patient refusing those visits. Social Work can not stand alone so it will need to be for PT or Nursing with Social Work. Please advise. - CFRi

## 2017-07-04 NOTE — Addendum Note (Signed)
Addended by: Collie SiadICHARDSON, Elery Cadenhead M on: 07/04/2017 02:30 PM   Modules accepted: Orders

## 2017-07-04 NOTE — Telephone Encounter (Signed)
OK for new referral?

## 2017-07-04 NOTE — Telephone Encounter (Signed)
Referral placed. [facility] to [facility].

## 2017-07-07 DIAGNOSIS — I10 Essential (primary) hypertension: Secondary | ICD-10-CM | POA: Diagnosis not present

## 2017-07-07 DIAGNOSIS — N3281 Overactive bladder: Secondary | ICD-10-CM | POA: Diagnosis not present

## 2017-07-07 DIAGNOSIS — M79672 Pain in left foot: Secondary | ICD-10-CM | POA: Diagnosis not present

## 2017-07-07 DIAGNOSIS — Z9181 History of falling: Secondary | ICD-10-CM | POA: Diagnosis not present

## 2017-07-07 DIAGNOSIS — E441 Mild protein-calorie malnutrition: Secondary | ICD-10-CM | POA: Diagnosis not present

## 2017-07-07 DIAGNOSIS — E559 Vitamin D deficiency, unspecified: Secondary | ICD-10-CM | POA: Diagnosis not present

## 2017-07-07 DIAGNOSIS — Z87440 Personal history of urinary (tract) infections: Secondary | ICD-10-CM | POA: Diagnosis not present

## 2017-07-07 DIAGNOSIS — G809 Cerebral palsy, unspecified: Secondary | ICD-10-CM | POA: Diagnosis not present

## 2017-07-07 DIAGNOSIS — R1313 Dysphagia, pharyngeal phase: Secondary | ICD-10-CM | POA: Diagnosis not present

## 2017-07-07 DIAGNOSIS — K589 Irritable bowel syndrome without diarrhea: Secondary | ICD-10-CM | POA: Diagnosis not present

## 2017-07-12 DIAGNOSIS — R1313 Dysphagia, pharyngeal phase: Secondary | ICD-10-CM | POA: Diagnosis not present

## 2017-07-12 DIAGNOSIS — E441 Mild protein-calorie malnutrition: Secondary | ICD-10-CM | POA: Diagnosis not present

## 2017-07-12 DIAGNOSIS — I10 Essential (primary) hypertension: Secondary | ICD-10-CM | POA: Diagnosis not present

## 2017-07-12 DIAGNOSIS — M79672 Pain in left foot: Secondary | ICD-10-CM | POA: Diagnosis not present

## 2017-07-12 DIAGNOSIS — N3281 Overactive bladder: Secondary | ICD-10-CM | POA: Diagnosis not present

## 2017-07-12 DIAGNOSIS — E559 Vitamin D deficiency, unspecified: Secondary | ICD-10-CM | POA: Diagnosis not present

## 2017-07-12 DIAGNOSIS — Z87440 Personal history of urinary (tract) infections: Secondary | ICD-10-CM | POA: Diagnosis not present

## 2017-07-12 DIAGNOSIS — K589 Irritable bowel syndrome without diarrhea: Secondary | ICD-10-CM | POA: Diagnosis not present

## 2017-07-12 DIAGNOSIS — Z9181 History of falling: Secondary | ICD-10-CM | POA: Diagnosis not present

## 2017-07-12 DIAGNOSIS — G809 Cerebral palsy, unspecified: Secondary | ICD-10-CM | POA: Diagnosis not present

## 2017-08-02 ENCOUNTER — Other Ambulatory Visit: Payer: Self-pay | Admitting: Family Medicine

## 2017-08-02 DIAGNOSIS — N390 Urinary tract infection, site not specified: Secondary | ICD-10-CM

## 2017-10-26 ENCOUNTER — Other Ambulatory Visit: Payer: Self-pay | Admitting: Family Medicine

## 2017-10-26 DIAGNOSIS — N390 Urinary tract infection, site not specified: Secondary | ICD-10-CM

## 2017-10-26 DIAGNOSIS — K589 Irritable bowel syndrome without diarrhea: Secondary | ICD-10-CM

## 2017-11-04 ENCOUNTER — Telehealth: Payer: Self-pay | Admitting: Family Medicine

## 2017-11-04 NOTE — Telephone Encounter (Signed)
We could certainly have home health come out but that would only be a temporary solution but they typically have a Child psychotherapistsocial worker who can help them lined up more chronic care and make sure the insurance would help cover it.  Okay to initiate referral for home health.

## 2017-11-04 NOTE — Telephone Encounter (Signed)
Pt's sister Junious DresserConnie called and would like to know if there was a way to get some in home care for [place]. She stated with her starting a new job it is hard for her to do everything. She is wanting to have someone come help [place] with her daily activities for a few hours a day or few days a week.She stated that she goes and stays with her on the weekends so she would only need weekday care. Thanks her sister Connie's number is [phone].  I dont see anyone on her DPR so I dont know what all can be discussed with her sister without the patient giving permission.

## 2017-11-05 NOTE — Telephone Encounter (Signed)
Unable to leave message, voicemail has not been set up. Patient needs to come in for office visit. Medicare requires a visit within 90 days of the referral.

## 2017-11-06 NOTE — Telephone Encounter (Signed)
Spoke with Anna Bowen Anna Bowen and she will call back to schedule a follow up appointment with Anna Bowen.

## 2017-11-11 ENCOUNTER — Ambulatory Visit (INDEPENDENT_AMBULATORY_CARE_PROVIDER_SITE_OTHER): Payer: Medicare Other | Admitting: Family Medicine

## 2017-11-11 ENCOUNTER — Ambulatory Visit (INDEPENDENT_AMBULATORY_CARE_PROVIDER_SITE_OTHER): Payer: Medicare Other

## 2017-11-11 ENCOUNTER — Encounter: Payer: Self-pay | Admitting: Family Medicine

## 2017-11-11 VITALS — BP 98/62 | HR 74 | Temp 98.2°F

## 2017-11-11 DIAGNOSIS — M8008XA Age-related osteoporosis with current pathological fracture, vertebra(e), initial encounter for fracture: Secondary | ICD-10-CM | POA: Diagnosis not present

## 2017-11-11 DIAGNOSIS — M545 Low back pain, unspecified: Secondary | ICD-10-CM

## 2017-11-11 DIAGNOSIS — S32010A Wedge compression fracture of first lumbar vertebra, initial encounter for closed fracture: Secondary | ICD-10-CM | POA: Diagnosis not present

## 2017-11-11 DIAGNOSIS — R7989 Other specified abnormal findings of blood chemistry: Secondary | ICD-10-CM

## 2017-11-11 DIAGNOSIS — M8448XA Pathological fracture, other site, initial encounter for fracture: Secondary | ICD-10-CM | POA: Diagnosis not present

## 2017-11-11 LAB — T4, FREE: FREE T4: 2.1 ng/dL — AB (ref 0.8–1.8)

## 2017-11-11 LAB — TSH: TSH: 0.01 mIU/L — ABNORMAL LOW (ref 0.40–4.50)

## 2017-11-11 LAB — T3, FREE: T3, Free: 4.4 pg/mL — ABNORMAL HIGH (ref 2.3–4.2)

## 2017-11-11 MED ORDER — CEFDINIR 300 MG PO CAPS: 300.0000 mg | ORAL_CAPSULE | Freq: Two times a day (BID) | ORAL | 0 refills | Status: DC

## 2017-11-11 NOTE — Progress Notes (Signed)
Anna Bowen is a 57 y.o. female who presents to [facility]: [facility] today for back pain. Anna Bowen has a history of CP and is confined to wheelchair. She notes a 2 week history of pain in the right lower back. The pain is severe and not typical. She notes ongoing urinary incontinence.  She denies any injury.  She denies any new radiating pain.  She denies fevers or chills.  She has a history of recurrent frequent urinary tract infections.  She has not been taking her nitrofurantoin prophylaxis recently. No fever, chills, NVD.   Additionally patient was seen previously and had labs in October 2018 that showed a suppressed TSH she did not return to clinic for recheck.    Patient has symptoms and lab exams findings in the severe range. Past Medical History:  Diagnosis Date  . Cerebral palsy ([place])    since birth   No past surgical history on file. Social History   Tobacco Use  . Smoking status: Never Smoker  . Smokeless tobacco: Never Used  Substance Use Topics  . Alcohol use: No   family history includes Cancer in her father; Diabetes in her brother; Hypertension in her mother; Lung disease in her mother; Osteoporosis in her mother.  ROS as above: No headache, visual changes, nausea, vomiting, diarrhea, constipation, dizziness, abdominal pain, skin rash, fevers, chills, night sweats,swollen lymph nodes, body aches, joint swelling, muscle aches, chest pain, shortness of breath, mood changes, visual or auditory hallucinations.   Medications: Current Outpatient Medications  Medication Sig Dispense Refill  . Cholecalciferol (VITAMIN D) 2000 units tablet Take 1 tablet (2,000 Units total) by mouth daily. 90 tablet 3  . ciclopirox (PENLAC) 8 % solution Apply topically at bedtime. Apply over nail and surrounding skin. After seven (7) days, may remove with alcohol and continue  cycle. 6.6 mL 2  . cyanocobalamin 500 MCG tablet Take 1 tablet (500 mcg total) by mouth daily. Take 1 tablet by mouth daily 90 tablet 3  . dicyclomine (BENTYL) 20 MG tablet TAKE 1 TABLET (20 MG TOTAL) BY MOUTH 2 (TWO) TIMES DAILY AS NEEDED. 180 tablet 1  . lisinopril (PRINIVIL,ZESTRIL) 20 MG tablet Take 1 tablet (20 mg total) by mouth daily. 90 tablet 1  . mupirocin ointment (BACTROBAN) 2 % APPLY TO AFFECTED AREA TWICE DAILY TO AREA ON THUMB 22 g 3  . oxybutynin (DITROPAN) 5 MG tablet Take 1 tablet (5 mg total) by mouth 3 (three) times daily. 360 tablet 1  . cefdinir (OMNICEF) 300 MG capsule Take 1 capsule (300 mg total) by mouth 2 (two) times daily. 14 capsule 0   No current facility-administered medications for this visit.    Allergies  Allergen Reactions  . Penicillins Rash    Health Maintenance Health Maintenance  Topic Date Due  . Hepatitis C Screening  1961/04/07  . HIV Screening  01/20/1976  . PAP SMEAR  01/19/1982  . COLONOSCOPY  01/20/2011  . INFLUENZA VACCINE  06/18/2018 (Originally 03/26/2017)  . MAMMOGRAM  12/12/2018  . TETANUS/TDAP  05/04/2019     Exam:  BP 98/62   Pulse 74   Temp 98.2 F (36.8 C) (Oral)  Gen: Chronically ill appearing.  HEENT: EOMI,  MMM Lungs: Normal work of breathing. CTABL Heart: RRR no MRG Abd: NABS, Soft. Nondistended, Nontender no CVA angle tenderness to percussion Spine: Significant scoliotic curve convex left  sitting in chair.  Nontender along the midline.  Tender  to palpation along the right T and L-spine paraspinal muscles and along the right inferior rib border.  Patient was unable to provide a urine sample  My independent personal review of the images of the x-ray thoracolumbar spine and lumbar spine show scoliotic appearance with decreased bone mineral density with no obvious acute fracture or compression fracture visible.  Exam is limited by positioning.  Awaiting formal radiology review.   Assessment and Plan: 57 y.o.  female with  Right-sided low back pain and flank pain. Etiology is unclear and the differential is broad. This could be pyelonephritis or a vertebral compression fracture or lumbosacral strain or a myriad of other mild to severe causes. My ability to fully workup the situation is limited in this patient is unable to provide a urine sample which is likely to be beneficial here.  Additionally the x-ray is limited as well.  Workup will additionally include CBC metabolic panel and will follow-up decreased TSH seen previously with TSH T3 and T4.  We will start empiric treatment today with [facility] for presumed urinary tract infection or pyelonephritis.  Recheck with PCP in the near future.  Return if not improved or if worsening.   Orders Placed This Encounter  Procedures  . DG Lumbar Spine 2-3 Views    Standing Status:   Future    Number of Occurrences:   1    Standing Expiration Date:   01/12/2019    Order Specific Question:   Reason for Exam (SYMPTOM  OR DIAGNOSIS REQUIRED)    Answer:   eval back pain    Order Specific Question:   Is patient pregnant?    Answer:   No    Order Specific Question:   Preferred imaging location?    Answer:   [facility]    Order Specific Question:   Radiology Contrast Protocol - do NOT remove file path    Answer:   \\[email or web address]\Radiant\DXFluoroContrastProtocols.pdf  . XR THORACOLUMABAR SPINE    Order Specific Question:   Reason for Exam (SYMPTOM  OR DIAGNOSIS REQUIRED)    Answer:   2 view if able right low back pain    Order Specific Question:   Is the patient pregnant?    Answer:   No    Order Specific Question:   Preferred imaging location?    Answer:   [facility]  . DG THORACOLUMABAR SPINE    Standing Status:   Future    Number of Occurrences:   1    Standing Expiration Date:   01/12/2019    Order Specific Question:   Reason for Exam (SYMPTOM  OR DIAGNOSIS REQUIRED)    Answer:   eval pain back    Order Specific Question:    Is patient pregnant?    Answer:   No    Order Specific Question:   Preferred imaging location?    Answer:   [facility]    Order Specific Question:   Radiology Contrast Protocol - do NOT remove file path    Answer:   \\[email or web address]\Radiant\DXFluoroContrastProtocols.pdf  . CBC with Differential/Platelet  . COMPLETE METABOLIC PANEL WITH GFR  . TSH  . T4, free  . T3, free  . POCT Urinalysis Dipstick   Meds ordered this encounter  Medications  . cefdinir (OMNICEF) 300 MG capsule    Sig: Take 1 capsule (300 mg total) by mouth 2 (two) times daily.    Dispense:  14 capsule    Refill:  0     Discussed warning signs  or symptoms. Please see discharge instructions. Patient expresses understanding.

## 2017-11-11 NOTE — Patient Instructions (Signed)
Thank you for coming in today. Start omnicef for possible UTI.  Get blood labs today.  Continue medicines.  Recheck with Dr Eppie GibsonMetheny or me in 1-2 weeks.  For pain continue Advil 2 pills every 8 hours as needed.    Urinary Tract Infection, Adult A urinary tract infection (UTI) is an infection of any part of the urinary tract. The urinary tract includes the:  Kidneys.  Ureters.  Bladder.  Urethra.  These organs make, store, and get rid of pee (urine) in the body. Follow these instructions at home:  Take over-the-counter and prescription medicines only as told by your doctor.  If you were prescribed an antibiotic medicine, take it as told by your doctor. Do not stop taking the antibiotic even if you start to feel better.  Avoid the following drinks: ? Alcohol. ? Caffeine. ? Tea. ? Carbonated drinks.  Drink enough fluid to keep your pee clear or pale yellow.  Keep all follow-up visits as told by your doctor. This is important.  Make sure to: ? Empty your bladder often and completely. Do not to hold pee for long periods of time. ? Empty your bladder before and after sex. ? Wipe from front to back after a bowel movement if you are female. Use each tissue one time when you wipe. Contact a doctor if:  You have back pain.  You have a fever.  You feel sick to your stomach (nauseous).  You throw up (vomit).  Your symptoms do not get better after 3 days.  Your symptoms go away and then come back. Get help right away if:  You have very bad back pain.  You have very bad lower belly (abdominal) pain.  You are throwing up and cannot keep down any medicines or water. This information is not intended to replace advice given to you by your health care provider. Make sure you discuss any questions you have with your health care provider. Document Released: 01/29/2008 Document Revised: 01/18/2016 Document Reviewed: 07/03/2015 Elsevier Interactive Patient Education  [employer]2018  Elsevier Inc.

## 2017-11-12 ENCOUNTER — Encounter: Payer: Self-pay | Admitting: Family Medicine

## 2017-11-12 DIAGNOSIS — S22000A Wedge compression fracture of unspecified thoracic vertebra, initial encounter for closed fracture: Secondary | ICD-10-CM | POA: Insufficient documentation

## 2017-11-12 DIAGNOSIS — S32020A Wedge compression fracture of second lumbar vertebra, initial encounter for closed fracture: Secondary | ICD-10-CM | POA: Insufficient documentation

## 2017-11-12 DIAGNOSIS — E0590 Thyrotoxicosis, unspecified without thyrotoxic crisis or storm: Secondary | ICD-10-CM | POA: Insufficient documentation

## 2017-11-12 LAB — COMPLETE METABOLIC PANEL WITH GFR
AG RATIO: 1.2 (calc) (ref 1.0–2.5)
ALT: 34 U/L — AB (ref 6–29)
AST: 38 U/L — AB (ref 10–35)
Albumin: 3.7 g/dL (ref 3.6–5.1)
Alkaline phosphatase (APISO): 101 U/L (ref 33–130)
BILIRUBIN TOTAL: 0.3 mg/dL (ref 0.2–1.2)
BUN/Creatinine Ratio: 70 (calc) — ABNORMAL HIGH (ref 6–22)
BUN: 57 mg/dL — AB (ref 7–25)
CALCIUM: 9.2 mg/dL (ref 8.6–10.4)
CHLORIDE: 111 mmol/L — AB (ref 98–110)
CO2: 21 mmol/L (ref 20–32)
Creat: 0.82 mg/dL (ref 0.50–1.05)
GFR, EST AFRICAN AMERICAN: 93 mL/min/{1.73_m2} (ref >=60)
GFR, Est Non African American: 80 mL/min/{1.73_m2} (ref >=60)
GLUCOSE: 97 mg/dL (ref 65–99)
Globulin: 3.1 g/dL (calc) (ref 1.9–3.7)
POTASSIUM: 3.6 mmol/L (ref 3.5–5.3)
Sodium: 141 mmol/L (ref 135–146)
TOTAL PROTEIN: 6.8 g/dL (ref 6.1–8.1)

## 2017-11-12 LAB — CBC WITH DIFFERENTIAL/PLATELET
BASOS PCT: 0.8 %
Basophils Absolute: 42 cells/uL (ref 0–200)
Eosinophils Absolute: 32 cells/uL (ref 15–500)
Eosinophils Relative: 0.6 %
HCT: 34 % — ABNORMAL LOW (ref 35.0–45.0)
Hemoglobin: 11.4 g/dL — ABNORMAL LOW (ref 11.7–15.5)
Lymphs Abs: 1654 cells/uL (ref 850–3900)
MCH: 30.4 pg (ref 27.0–33.0)
MCHC: 33.5 g/dL (ref 32.0–36.0)
MCV: 90.7 fL (ref 80.0–100.0)
MONOS PCT: 11.6 %
MPV: 10.7 fL (ref 7.5–12.5)
NEUTROS PCT: 55.8 %
Neutro Abs: 2957 cells/uL (ref 1500–7800)
PLATELETS: 225 10*3/uL (ref 140–400)
RBC: 3.75 10*6/uL — AB (ref 3.80–5.10)
RDW: 12.1 % (ref 11.0–15.0)
TOTAL LYMPHOCYTE: 31.2 %
WBC mixed population: 615 cells/uL (ref 200–950)
WBC: 5.3 10*3/uL (ref 3.8–10.8)

## 2017-11-18 ENCOUNTER — Ambulatory Visit (INDEPENDENT_AMBULATORY_CARE_PROVIDER_SITE_OTHER): Payer: Medicare Other | Admitting: Family Medicine

## 2017-11-18 ENCOUNTER — Encounter: Payer: Self-pay | Admitting: Family Medicine

## 2017-11-18 ENCOUNTER — Ambulatory Visit (INDEPENDENT_AMBULATORY_CARE_PROVIDER_SITE_OTHER): Payer: Medicare Other

## 2017-11-18 VITALS — BP 105/67 | HR 74

## 2017-11-18 DIAGNOSIS — S22000A Wedge compression fracture of unspecified thoracic vertebra, initial encounter for closed fracture: Secondary | ICD-10-CM

## 2017-11-18 DIAGNOSIS — S22000D Wedge compression fracture of unspecified thoracic vertebra, subsequent encounter for fracture with routine healing: Secondary | ICD-10-CM

## 2017-11-18 DIAGNOSIS — R634 Abnormal weight loss: Secondary | ICD-10-CM

## 2017-11-18 DIAGNOSIS — X58XXXD Exposure to other specified factors, subsequent encounter: Secondary | ICD-10-CM

## 2017-11-18 DIAGNOSIS — S32020A Wedge compression fracture of second lumbar vertebra, initial encounter for closed fracture: Secondary | ICD-10-CM | POA: Diagnosis not present

## 2017-11-18 DIAGNOSIS — E559 Vitamin D deficiency, unspecified: Secondary | ICD-10-CM | POA: Diagnosis not present

## 2017-11-18 DIAGNOSIS — E0590 Thyrotoxicosis, unspecified without thyrotoxic crisis or storm: Secondary | ICD-10-CM | POA: Diagnosis not present

## 2017-11-18 DIAGNOSIS — R109 Unspecified abdominal pain: Secondary | ICD-10-CM

## 2017-11-18 DIAGNOSIS — Z1321 Encounter for screening for nutritional disorder: Secondary | ICD-10-CM | POA: Diagnosis not present

## 2017-11-18 NOTE — Progress Notes (Signed)
Anna Bowen is a 57 y.o. female who [facility] today for follow up right flank pain, compression fractures, hyperthyroid.   Right flank pain: Seen 1 week ago x-rays of thoracic and lumbar spine showed multiple old vertebral compression fractures.  She had no pathology noted in the location of the majority of her pain in the right flank. She was treated empirically for a possible UTI after being unable to provide an urine sample. She notes that she is only a little better. She denies any urinary frequency or urgency.   She was found to have subacute or chronic vertebral compression fractures in her T and L spine. She denies any pain along the spinal midline today. Radiology recommended CT scan to evaluate the compression fractures.   She was also found to have a suppressed TSH and elevated T4 and T3. The low TSH was noted in the fall of 2018 but Anna Bowen was lost to follow up. She notes that she feels cold often but denies any palpitations or chest pain.    Past Medical History:  Diagnosis Date  . Cerebral palsy (HCC)    since birth   No past surgical history on file. Social History   Tobacco Use  . Smoking status: Never Smoker  . Smokeless tobacco: Never Used  Substance Use Topics  . Alcohol use: No   family history includes Cancer in her father; Diabetes in her brother; Hypertension in her mother; Lung disease in her mother; Osteoporosis in her mother.  ROS as above:  Medications: Current Outpatient Medications  Medication Sig Dispense Refill  . Cholecalciferol (VITAMIN D) 2000 units tablet Take 1 tablet (2,000 Units total) by mouth daily. 90 tablet 3  . ciclopirox (PENLAC) 8 % solution Apply topically at bedtime. Apply over nail and surrounding skin. After seven (7) days, may remove with alcohol and continue cycle. 6.6 mL 2  . cyanocobalamin 500  MCG tablet Take 1 tablet (500 mcg total) by mouth daily. Take 1 tablet by mouth daily 90 tablet 3  . dicyclomine (BENTYL) 20 MG tablet TAKE 1 TABLET (20 MG TOTAL) BY MOUTH 2 (TWO) TIMES DAILY AS NEEDED. 180 tablet 1  . lisinopril (PRINIVIL,ZESTRIL) 20 MG tablet Take 1 tablet (20 mg total) by mouth daily. 90 tablet 1  . mupirocin ointment (BACTROBAN) 2 % APPLY TO AFFECTED AREA TWICE DAILY TO AREA ON THUMB 22 g 3  . oxybutynin (DITROPAN) 5 MG tablet Take 1 tablet (5 mg total) by mouth 3 (three) times daily. 360 tablet 1   No current facility-administered medications for this visit.    Allergies  Allergen Reactions  . Penicillins Rash    Health Maintenance Health Maintenance  Topic Date Due  . Hepatitis C Screening  Jul 27, 1961  . HIV Screening  01/20/1976  . PAP SMEAR  01/19/1982  . COLONOSCOPY  01/20/2011  . INFLUENZA VACCINE  06/18/2018 (Originally 03/26/2017)  . MAMMOGRAM  12/12/2018  . TETANUS/TDAP  05/04/2019     Exam:  BP 105/67   Pulse 74  Gen: Well NAD HEENT: EOMI,  MMM Lungs: Normal work of breathing. CTABL Heart: RRR no MRG Abd: NABS, Soft. Nondistended, TTP right upper quadrant abdomen and right lower ribs.  Exts: Brisk capillary refill, warm and well perfused.  MSK; Non-tender to spinal midline.   My independent review of the images from the CT scan of the abdomen and pelvis distended bladder.  Waiting on radiology review  Lab Results  Component Value Date   TSH 0.01 (L) 11/11/2017     Chemistry      Component Value Date/Time   NA 141 11/11/2017 1625   K 3.6 11/11/2017 1625   CL 111 (H) 11/11/2017 1625   CO2 21 11/11/2017 1625   BUN 57 (H) 11/11/2017 1625   CREATININE 0.82 11/11/2017 1625      Component Value Date/Time   CALCIUM 9.2 11/11/2017 1625   ALKPHOS 72 05/14/2016 1357   AST 38 (H) 11/11/2017 1625   ALT 34 (H) 11/11/2017 1625   BILITOT 0.3 11/11/2017 1625     Lab Results  Component Value Date   WBC 5.3 11/11/2017   HGB 11.4 (L) 11/11/2017    HCT 34.0 (L) 11/11/2017   MCV 90.7 11/11/2017   PLT 225 11/11/2017      Assessment and Plan: 57 y.o. female with right flank pain. CT scan read pending. Concerning for massive bladder distension. Will finalize plan when Ct scan back. Continue pain management with OTC. Will start home health PT and nursing services as well.   Compression fractures: CT scan pending.  Check PTH, calcium and Vit D. Start home health PT.   Hyperthyroid: Check Thyroid stimulating antibodies and refer to endocrinology. Likely will need uptake scan.      Orders Placed This Encounter  Procedures  . CT Abdomen Pelvis Wo Contrast    Standing Status:   Future    Number of Occurrences:   1    Standing Expiration Date:   02/19/2019    Order Specific Question:   Is patient pregnant?    Answer:   No    Order Specific Question:   Preferred imaging location?    Answer:   [facility]    Order Specific Question:   Radiology Contrast Protocol - do NOT remove file path    Answer:   \\[email or web address]\Radiant\CTProtocols.pdf  . Thyroid stimulating immunoglobulin  . PTH, Intact and Calcium  . VITAMIN D 25 Hydroxy (Vit-D Deficiency, Fractures)   No orders of the defined types were placed in this encounter.    Discussed warning signs or symptoms. Please see discharge instructions. Patient expresses understanding.

## 2017-11-18 NOTE — Patient Instructions (Signed)
Thank you for coming in today. Get labs and CT scan.  You should hear from Dr Cherie DarkLevey.  Please make sure to listen to your voicemail and reply to phone calls.   Recheck with me or Dr Judie PetitM if worsening in 1 month.  Otherwise follow up in 1 month.

## 2017-11-23 LAB — PTH, INTACT AND CALCIUM
Calcium: 9.2 mg/dL (ref 8.6–10.4)
PTH: 31 pg/mL (ref 14–64)

## 2017-11-23 LAB — VITAMIN D 25 HYDROXY (VIT D DEFICIENCY, FRACTURES): Vit D, 25-Hydroxy: 45 ng/mL (ref 30–100)

## 2017-11-23 LAB — THYROID STIMULATING IMMUNOGLOBULIN: TSI: 153 %{baseline} — AB (ref <140)

## 2017-11-26 ENCOUNTER — Telehealth: Payer: Self-pay | Admitting: Family Medicine

## 2017-11-26 DIAGNOSIS — E0590 Thyrotoxicosis, unspecified without thyrotoxic crisis or storm: Secondary | ICD-10-CM | POA: Diagnosis not present

## 2017-11-26 DIAGNOSIS — R109 Unspecified abdominal pain: Secondary | ICD-10-CM | POA: Diagnosis not present

## 2017-11-26 DIAGNOSIS — I10 Essential (primary) hypertension: Secondary | ICD-10-CM | POA: Diagnosis not present

## 2017-11-26 DIAGNOSIS — S22000D Wedge compression fracture of unspecified thoracic vertebra, subsequent encounter for fracture with routine healing: Secondary | ICD-10-CM | POA: Diagnosis not present

## 2017-11-26 DIAGNOSIS — S32020D Wedge compression fracture of second lumbar vertebra, subsequent encounter for fracture with routine healing: Secondary | ICD-10-CM | POA: Diagnosis not present

## 2017-11-26 NOTE — Telephone Encounter (Signed)
Dr Denyse Amassorey   Received fax from RN at [facility] who was starting Care for Ms. Anna Bowen and she states patient really needs Personal Care Services not home health. Please place referral for these services so I can [facility] to get [facility] set up as soon as possible   Thank you Arline Aspindy

## 2017-11-27 MED ORDER — AMBULATORY NON FORMULARY MEDICATION: 0 refills | Status: DC

## 2017-11-27 NOTE — Telephone Encounter (Signed)
I dont have a way to refer for that in the system. I generated a printed prescription for those services that I am sure I will need to revise.

## 2017-12-02 DIAGNOSIS — I10 Essential (primary) hypertension: Secondary | ICD-10-CM | POA: Diagnosis not present

## 2017-12-02 DIAGNOSIS — E0590 Thyrotoxicosis, unspecified without thyrotoxic crisis or storm: Secondary | ICD-10-CM | POA: Diagnosis not present

## 2017-12-02 DIAGNOSIS — R109 Unspecified abdominal pain: Secondary | ICD-10-CM | POA: Diagnosis not present

## 2017-12-02 DIAGNOSIS — S32020D Wedge compression fracture of second lumbar vertebra, subsequent encounter for fracture with routine healing: Secondary | ICD-10-CM | POA: Diagnosis not present

## 2017-12-02 DIAGNOSIS — S22000D Wedge compression fracture of unspecified thoracic vertebra, subsequent encounter for fracture with routine healing: Secondary | ICD-10-CM | POA: Diagnosis not present

## 2017-12-03 DIAGNOSIS — S22000D Wedge compression fracture of unspecified thoracic vertebra, subsequent encounter for fracture with routine healing: Secondary | ICD-10-CM | POA: Diagnosis not present

## 2017-12-03 DIAGNOSIS — E0590 Thyrotoxicosis, unspecified without thyrotoxic crisis or storm: Secondary | ICD-10-CM | POA: Diagnosis not present

## 2017-12-03 DIAGNOSIS — I10 Essential (primary) hypertension: Secondary | ICD-10-CM | POA: Diagnosis not present

## 2017-12-03 DIAGNOSIS — R109 Unspecified abdominal pain: Secondary | ICD-10-CM | POA: Diagnosis not present

## 2017-12-03 DIAGNOSIS — S32020D Wedge compression fracture of second lumbar vertebra, subsequent encounter for fracture with routine healing: Secondary | ICD-10-CM | POA: Diagnosis not present

## 2017-12-10 DIAGNOSIS — E0590 Thyrotoxicosis, unspecified without thyrotoxic crisis or storm: Secondary | ICD-10-CM | POA: Diagnosis not present

## 2017-12-10 DIAGNOSIS — S32020D Wedge compression fracture of second lumbar vertebra, subsequent encounter for fracture with routine healing: Secondary | ICD-10-CM | POA: Diagnosis not present

## 2017-12-10 DIAGNOSIS — S22000D Wedge compression fracture of unspecified thoracic vertebra, subsequent encounter for fracture with routine healing: Secondary | ICD-10-CM | POA: Diagnosis not present

## 2017-12-10 DIAGNOSIS — R109 Unspecified abdominal pain: Secondary | ICD-10-CM | POA: Diagnosis not present

## 2017-12-10 DIAGNOSIS — I10 Essential (primary) hypertension: Secondary | ICD-10-CM | POA: Diagnosis not present

## 2017-12-15 ENCOUNTER — Encounter: Payer: Self-pay | Admitting: Family Medicine

## 2017-12-15 ENCOUNTER — Ambulatory Visit (INDEPENDENT_AMBULATORY_CARE_PROVIDER_SITE_OTHER): Payer: Medicare Other | Admitting: Family Medicine

## 2017-12-15 VITALS — BP 111/56 | HR 81

## 2017-12-15 DIAGNOSIS — T7401XA Adult neglect or abandonment, confirmed, initial encounter: Secondary | ICD-10-CM

## 2017-12-15 DIAGNOSIS — S22000A Wedge compression fracture of unspecified thoracic vertebra, initial encounter for closed fracture: Secondary | ICD-10-CM | POA: Diagnosis not present

## 2017-12-15 DIAGNOSIS — F418 Other specified anxiety disorders: Secondary | ICD-10-CM

## 2017-12-15 DIAGNOSIS — M7989 Other specified soft tissue disorders: Secondary | ICD-10-CM

## 2017-12-15 DIAGNOSIS — E0590 Thyrotoxicosis, unspecified without thyrotoxic crisis or storm: Secondary | ICD-10-CM

## 2017-12-15 DIAGNOSIS — M8080XA Other osteoporosis with current pathological fracture, unspecified site, initial encounter for fracture: Secondary | ICD-10-CM | POA: Diagnosis not present

## 2017-12-15 DIAGNOSIS — M8000XA Age-related osteoporosis with current pathological fracture, unspecified site, initial encounter for fracture: Secondary | ICD-10-CM | POA: Insufficient documentation

## 2017-12-15 MED ORDER — VITAMIN D (ERGOCALCIFEROL) 1.25 MG (50000 UNIT) PO CAPS: 50000.0000 [IU] | ORAL_CAPSULE | ORAL | 3 refills | Status: DC

## 2017-12-15 MED ORDER — CYANOCOBALAMIN 500 MCG PO TABS: 500.0000 ug | ORAL_TABLET | Freq: Every day | ORAL | 3 refills | Status: DC

## 2017-12-15 MED ORDER — ACETAMINOPHEN 500 MG PO TABS: 500.0000 mg | ORAL_TABLET | Freq: Three times a day (TID) | ORAL | 1 refills | Status: DC

## 2017-12-15 NOTE — Progress Notes (Signed)
Subjective:    Patient ID: Anna Bowen, female    DOB: [birth year], 57 y.o.   MRN: [id number]  HPI 57 yo female is here for f/u of compression fracture of the lumbar spine and osteoporosis of the spine.  She is still having a lot of pain in the right side of her back.  Worse with position change.  She is wheelchair bound.  She is not taking any pain medicxation, etc. She tried ADvil but says it gives her loose stools.  She has no one during the daytime to help her to clean herself up and her brother is unwilling to do so when he is home.  She is still living with her brother who is not very supportive of her.  She says he no longer wants to purchase her vitamin D and B12 scine it is OTC. She says her brother doesn't believe that she has a vertebral fracture.    She is here today with her cousin. Her counsin is able to visit at times but not often.    Reports that she has been doing a little bit more for herself.  She is learned to use the microwave and is actually been making her own coffee in the mornings.  She still purposely does not drink much at all during the day so that she does not have to go to the bathroom until her brother comes home and can help her.   Review of Systems  BP (!) 111/56   Pulse 81   SpO2 95%     Allergies  Allergen Reactions  . Penicillins Rash    Past Medical History:  Diagnosis Date  . Cerebral palsy ([facility]CC)    since birth    No past surgical history on file.  Social History   Socioeconomic History  . Marital status: Single    Spouse name: Not on file  . Number of children: Not on file  . Years of education: Not on file  . Highest education level: Not on file  Occupational History  . Not on file  Social Needs  . Financial resource strain: Not on file  . Food insecurity:    Worry: Not on file    Inability: Not on file  . Transportation needs:    Medical: Not on file    Non-medical: Not on file  Tobacco Use  . Smoking status: Never  Smoker  . Smokeless tobacco: Never Used  Substance and Sexual Activity  . Alcohol use: No  . Drug use: No  . Sexual activity: Never    Comment: diabled, no regular exercise.   Lifestyle  . Physical activity:    Days per week: Not on file    Minutes per session: Not on file  . Stress: Not on file  Relationships  . Social connections:    Talks on phone: Not on file    Gets together: Not on file    Attends religious service: Not on file    Active member of club or organization: Not on file    Attends meetings of clubs or organizations: Not on file    Relationship status: Not on file  . Intimate partner violence:    Fear of current or ex partner: Not on file    Emotionally abused: Not on file    Physically abused: Not on file    Forced sexual activity: Not on file  Other Topics Concern  . Not on file  Social History Narrative  .  Not on file    Family History  Problem Relation Age of Onset  . Cancer Father        brain tumor died age 57  . Diabetes Brother   . Osteoporosis Mother   . Lung disease Mother        Restrictive\  . Hypertension Mother     Outpatient Encounter Medications as of 12/15/2017  Medication Sig  . AMBULATORY NON FORMULARY MEDICATION Refer for personal care services due to poor mobility and deconditioning.  . ciclopirox (PENLAC) 8 % solution Apply topically at bedtime. Apply over nail and surrounding skin. After seven (7) days, may remove with alcohol and continue cycle.  . dicyclomine (BENTYL) 20 MG tablet TAKE 1 TABLET (20 MG TOTAL) BY MOUTH 2 (TWO) TIMES DAILY AS NEEDED.  Marland Kitchen. lisinopril (PRINIVIL,ZESTRIL) 20 MG tablet Take 1 tablet (20 mg total) by mouth daily.  . mupirocin ointment (BACTROBAN) 2 % APPLY TO AFFECTED AREA TWICE DAILY TO AREA ON THUMB  . oxybutynin (DITROPAN) 5 MG tablet Take 1 tablet (5 mg total) by mouth 3 (three) times daily.  Marland Kitchen. acetaminophen (TYLENOL) 500 MG tablet Take 1 tablet (500 mg total) by mouth every 8 (eight) hours.  .  vitamin B-12 (CYANOCOBALAMIN) 500 MCG tablet Take 1 tablet (500 mcg total) by mouth daily. Take 1 tablet by mouth daily  . Vitamin D, Ergocalciferol, (DRISDOL) 50000 units CAPS capsule Take 1 capsule (50,000 Units total) by mouth every 7 (seven) days.  . [DISCONTINUED] Cholecalciferol (VITAMIN D) 2000 units tablet Take 1 tablet (2,000 Units total) by mouth daily.  . [DISCONTINUED] cyanocobalamin 500 MCG tablet Take 1 tablet (500 mcg total) by mouth daily. Take 1 tablet by mouth daily   No facility-administered encounter medications on file as of 12/15/2017.          Objective:   Physical Exam  Constitutional: She is oriented to person, place, and time. She appears well-developed and well-nourished.  HENT:  Head: Normocephalic and atraumatic.  Cardiovascular: Normal rate, regular rhythm and normal heart sounds.  Pulmonary/Chest: Effort normal and breath sounds normal.  Neurological: She is alert and oriented to person, place, and time.  Skin: Skin is warm and dry.  2+ pitting edema bilaterally on feet and ankles to just below the knees.   Psychiatric: She has a normal mood and affect. Her behavior is normal.        Assessment & Plan:  Fracture of lumbar spine - recommend Tylenol Arthritis TID for pain for the next several weeks. Sent as a Rx.  Will see why PT has not been coming out.    Osteoporosis with compression fractures- needs to be on vitamin D.  Will send a Rx.    Hyperthyroidism - has appt with Endocrinology in about 2 weeks.    Bilat LE swelling.  Recent renal function is normal.  We have been unable to collect a UA to evaluate for proteinuria or infection.    Social situation.  Recommend social work referral. Discussed that she needs to be in assisted living or nursing home. She is not safe or well cared for living with her brother.  She would really benefit from personal care services as she is wheelchair-bound unremarkable to stand and walk and is having significant  difficulty even with just transitioning from chair to bed etc.  Depression/Anxiety - PHQ- 9 score of 14 and GAD 7 score of 17.  She does report feeling better off dead at times.

## 2017-12-15 NOTE — Patient Instructions (Signed)
I sent a script for your prescription strength vitamin D and vitamin B.   Please make sure taking these.   The vitamin D is only once a week since prescription strength.   We will check on the home health nurse and see why PT is not coming out.

## 2017-12-16 ENCOUNTER — Telehealth: Payer: Self-pay | Admitting: Family Medicine

## 2017-12-16 NOTE — Telephone Encounter (Signed)
Please call home health. We ordered referral. Pt says nurse has come out bit  Not PT.  Please have them send out someone for PT. Also please have then do a social work consult. Would like to have them help her to apply for Assisted living at [facility] in [place]. She is not safe to live with her brother.

## 2017-12-17 ENCOUNTER — Ambulatory Visit: Payer: Medicare Other | Admitting: Family Medicine

## 2017-12-17 DIAGNOSIS — I10 Essential (primary) hypertension: Secondary | ICD-10-CM | POA: Diagnosis not present

## 2017-12-17 DIAGNOSIS — E0590 Thyrotoxicosis, unspecified without thyrotoxic crisis or storm: Secondary | ICD-10-CM | POA: Diagnosis not present

## 2017-12-17 DIAGNOSIS — S22000D Wedge compression fracture of unspecified thoracic vertebra, subsequent encounter for fracture with routine healing: Secondary | ICD-10-CM | POA: Diagnosis not present

## 2017-12-17 DIAGNOSIS — S32020D Wedge compression fracture of second lumbar vertebra, subsequent encounter for fracture with routine healing: Secondary | ICD-10-CM | POA: Diagnosis not present

## 2017-12-17 DIAGNOSIS — R109 Unspecified abdominal pain: Secondary | ICD-10-CM | POA: Diagnosis not present

## 2017-12-17 NOTE — Telephone Encounter (Signed)
Left VM for [facility] to call us back regarding orders.

## 2017-12-18 DIAGNOSIS — T7401XA Adult neglect or abandonment, confirmed, initial encounter: Secondary | ICD-10-CM | POA: Insufficient documentation

## 2017-12-18 DIAGNOSIS — F418 Other specified anxiety disorders: Secondary | ICD-10-CM | POA: Insufficient documentation

## 2017-12-19 ENCOUNTER — Encounter: Payer: Self-pay | Admitting: Family Medicine

## 2017-12-23 DIAGNOSIS — I10 Essential (primary) hypertension: Secondary | ICD-10-CM | POA: Diagnosis not present

## 2017-12-23 DIAGNOSIS — E0590 Thyrotoxicosis, unspecified without thyrotoxic crisis or storm: Secondary | ICD-10-CM | POA: Diagnosis not present

## 2017-12-23 DIAGNOSIS — S22000D Wedge compression fracture of unspecified thoracic vertebra, subsequent encounter for fracture with routine healing: Secondary | ICD-10-CM | POA: Diagnosis not present

## 2017-12-23 DIAGNOSIS — R109 Unspecified abdominal pain: Secondary | ICD-10-CM | POA: Diagnosis not present

## 2017-12-23 DIAGNOSIS — S32020D Wedge compression fracture of second lumbar vertebra, subsequent encounter for fracture with routine healing: Secondary | ICD-10-CM | POA: Diagnosis not present

## 2017-12-25 DIAGNOSIS — R600 Localized edema: Secondary | ICD-10-CM | POA: Diagnosis not present

## 2017-12-25 DIAGNOSIS — S22000S Wedge compression fracture of unspecified thoracic vertebra, sequela: Secondary | ICD-10-CM | POA: Diagnosis not present

## 2017-12-25 DIAGNOSIS — E0590 Thyrotoxicosis, unspecified without thyrotoxic crisis or storm: Secondary | ICD-10-CM | POA: Diagnosis not present

## 2017-12-26 NOTE — Telephone Encounter (Signed)
I have not heard from them or seen any additional paperwork for me to sign.  Can we please try to contact them again.

## 2017-12-26 NOTE — Telephone Encounter (Signed)
PT Christiane Ha, [phone]) called, he advised he has gone out to assess Pt for PT but she is unable to do any of the exercises. Christiane Ha states he still has gone out "a couple times to take food" because the Pt states "she isn't being fed." He does report the home environment is "very dirty" and fears one day he will come to visit and she "wont be alive anymore."   They do not have any social workers with [facility], but they do refer that out. Christiane Ha is going to notify the office that we need this process stated ASAP, and they will contact us with further quesitons.   [facility] has been out to do an eval for home nurse aid. Unsure of current status.

## 2017-12-26 NOTE — Telephone Encounter (Signed)
Called [facility]. They advised a PT eval was done, but do not see a plan of care. Will follow up on this and contact us with a status update.

## 2017-12-29 DIAGNOSIS — I10 Essential (primary) hypertension: Secondary | ICD-10-CM | POA: Diagnosis not present

## 2017-12-29 DIAGNOSIS — E0590 Thyrotoxicosis, unspecified without thyrotoxic crisis or storm: Secondary | ICD-10-CM | POA: Diagnosis not present

## 2017-12-29 DIAGNOSIS — S22000D Wedge compression fracture of unspecified thoracic vertebra, subsequent encounter for fracture with routine healing: Secondary | ICD-10-CM | POA: Diagnosis not present

## 2017-12-29 DIAGNOSIS — R109 Unspecified abdominal pain: Secondary | ICD-10-CM | POA: Diagnosis not present

## 2017-12-29 DIAGNOSIS — S32020D Wedge compression fracture of second lumbar vertebra, subsequent encounter for fracture with routine healing: Secondary | ICD-10-CM | POA: Diagnosis not present

## 2018-01-05 DIAGNOSIS — E0590 Thyrotoxicosis, unspecified without thyrotoxic crisis or storm: Secondary | ICD-10-CM | POA: Diagnosis not present

## 2018-01-05 DIAGNOSIS — R109 Unspecified abdominal pain: Secondary | ICD-10-CM | POA: Diagnosis not present

## 2018-01-05 DIAGNOSIS — S32020D Wedge compression fracture of second lumbar vertebra, subsequent encounter for fracture with routine healing: Secondary | ICD-10-CM | POA: Diagnosis not present

## 2018-01-05 DIAGNOSIS — S22000D Wedge compression fracture of unspecified thoracic vertebra, subsequent encounter for fracture with routine healing: Secondary | ICD-10-CM | POA: Diagnosis not present

## 2018-01-05 DIAGNOSIS — I10 Essential (primary) hypertension: Secondary | ICD-10-CM | POA: Diagnosis not present

## 2018-01-15 DIAGNOSIS — S22000D Wedge compression fracture of unspecified thoracic vertebra, subsequent encounter for fracture with routine healing: Secondary | ICD-10-CM | POA: Diagnosis not present

## 2018-01-15 DIAGNOSIS — I10 Essential (primary) hypertension: Secondary | ICD-10-CM | POA: Diagnosis not present

## 2018-01-15 DIAGNOSIS — E0590 Thyrotoxicosis, unspecified without thyrotoxic crisis or storm: Secondary | ICD-10-CM | POA: Diagnosis not present

## 2018-01-15 DIAGNOSIS — R109 Unspecified abdominal pain: Secondary | ICD-10-CM | POA: Diagnosis not present

## 2018-01-15 DIAGNOSIS — S32020D Wedge compression fracture of second lumbar vertebra, subsequent encounter for fracture with routine healing: Secondary | ICD-10-CM | POA: Diagnosis not present

## 2018-01-16 ENCOUNTER — Other Ambulatory Visit: Payer: Self-pay | Admitting: Family Medicine

## 2018-01-18 ENCOUNTER — Other Ambulatory Visit: Payer: Self-pay | Admitting: Family Medicine

## 2018-01-18 DIAGNOSIS — N390 Urinary tract infection, site not specified: Secondary | ICD-10-CM

## 2018-01-21 DIAGNOSIS — I10 Essential (primary) hypertension: Secondary | ICD-10-CM | POA: Diagnosis not present

## 2018-01-21 DIAGNOSIS — R109 Unspecified abdominal pain: Secondary | ICD-10-CM | POA: Diagnosis not present

## 2018-01-21 DIAGNOSIS — E0590 Thyrotoxicosis, unspecified without thyrotoxic crisis or storm: Secondary | ICD-10-CM | POA: Diagnosis not present

## 2018-01-21 DIAGNOSIS — S32020D Wedge compression fracture of second lumbar vertebra, subsequent encounter for fracture with routine healing: Secondary | ICD-10-CM | POA: Diagnosis not present

## 2018-01-21 DIAGNOSIS — S22000D Wedge compression fracture of unspecified thoracic vertebra, subsequent encounter for fracture with routine healing: Secondary | ICD-10-CM | POA: Diagnosis not present

## 2018-01-28 DIAGNOSIS — S22000D Wedge compression fracture of unspecified thoracic vertebra, subsequent encounter for fracture with routine healing: Secondary | ICD-10-CM | POA: Diagnosis not present

## 2018-01-28 DIAGNOSIS — R109 Unspecified abdominal pain: Secondary | ICD-10-CM | POA: Diagnosis not present

## 2018-01-28 DIAGNOSIS — S32020D Wedge compression fracture of second lumbar vertebra, subsequent encounter for fracture with routine healing: Secondary | ICD-10-CM | POA: Diagnosis not present

## 2018-01-28 DIAGNOSIS — I10 Essential (primary) hypertension: Secondary | ICD-10-CM | POA: Diagnosis not present

## 2018-01-28 DIAGNOSIS — E0590 Thyrotoxicosis, unspecified without thyrotoxic crisis or storm: Secondary | ICD-10-CM | POA: Diagnosis not present

## 2018-02-04 DIAGNOSIS — I10 Essential (primary) hypertension: Secondary | ICD-10-CM | POA: Diagnosis not present

## 2018-02-04 DIAGNOSIS — E0590 Thyrotoxicosis, unspecified without thyrotoxic crisis or storm: Secondary | ICD-10-CM | POA: Diagnosis not present

## 2018-02-04 DIAGNOSIS — R109 Unspecified abdominal pain: Secondary | ICD-10-CM | POA: Diagnosis not present

## 2018-02-04 DIAGNOSIS — S22000D Wedge compression fracture of unspecified thoracic vertebra, subsequent encounter for fracture with routine healing: Secondary | ICD-10-CM | POA: Diagnosis not present

## 2018-02-04 DIAGNOSIS — S32020D Wedge compression fracture of second lumbar vertebra, subsequent encounter for fracture with routine healing: Secondary | ICD-10-CM | POA: Diagnosis not present

## 2018-02-11 DIAGNOSIS — S32020D Wedge compression fracture of second lumbar vertebra, subsequent encounter for fracture with routine healing: Secondary | ICD-10-CM | POA: Diagnosis not present

## 2018-02-11 DIAGNOSIS — S22000D Wedge compression fracture of unspecified thoracic vertebra, subsequent encounter for fracture with routine healing: Secondary | ICD-10-CM | POA: Diagnosis not present

## 2018-02-11 DIAGNOSIS — R109 Unspecified abdominal pain: Secondary | ICD-10-CM | POA: Diagnosis not present

## 2018-02-11 DIAGNOSIS — I10 Essential (primary) hypertension: Secondary | ICD-10-CM | POA: Diagnosis not present

## 2018-02-11 DIAGNOSIS — E0590 Thyrotoxicosis, unspecified without thyrotoxic crisis or storm: Secondary | ICD-10-CM | POA: Diagnosis not present

## 2018-02-18 DIAGNOSIS — S32020D Wedge compression fracture of second lumbar vertebra, subsequent encounter for fracture with routine healing: Secondary | ICD-10-CM | POA: Diagnosis not present

## 2018-02-18 DIAGNOSIS — R109 Unspecified abdominal pain: Secondary | ICD-10-CM | POA: Diagnosis not present

## 2018-02-18 DIAGNOSIS — E0590 Thyrotoxicosis, unspecified without thyrotoxic crisis or storm: Secondary | ICD-10-CM | POA: Diagnosis not present

## 2018-02-18 DIAGNOSIS — I10 Essential (primary) hypertension: Secondary | ICD-10-CM | POA: Diagnosis not present

## 2018-02-18 DIAGNOSIS — S22000D Wedge compression fracture of unspecified thoracic vertebra, subsequent encounter for fracture with routine healing: Secondary | ICD-10-CM | POA: Diagnosis not present

## 2018-02-25 DIAGNOSIS — I10 Essential (primary) hypertension: Secondary | ICD-10-CM | POA: Diagnosis not present

## 2018-02-25 DIAGNOSIS — S32020D Wedge compression fracture of second lumbar vertebra, subsequent encounter for fracture with routine healing: Secondary | ICD-10-CM | POA: Diagnosis not present

## 2018-02-25 DIAGNOSIS — R109 Unspecified abdominal pain: Secondary | ICD-10-CM | POA: Diagnosis not present

## 2018-02-25 DIAGNOSIS — E0590 Thyrotoxicosis, unspecified without thyrotoxic crisis or storm: Secondary | ICD-10-CM | POA: Diagnosis not present

## 2018-02-25 DIAGNOSIS — S22000D Wedge compression fracture of unspecified thoracic vertebra, subsequent encounter for fracture with routine healing: Secondary | ICD-10-CM | POA: Diagnosis not present

## 2018-03-04 DIAGNOSIS — S32020D Wedge compression fracture of second lumbar vertebra, subsequent encounter for fracture with routine healing: Secondary | ICD-10-CM | POA: Diagnosis not present

## 2018-03-04 DIAGNOSIS — S22000D Wedge compression fracture of unspecified thoracic vertebra, subsequent encounter for fracture with routine healing: Secondary | ICD-10-CM | POA: Diagnosis not present

## 2018-03-04 DIAGNOSIS — E0590 Thyrotoxicosis, unspecified without thyrotoxic crisis or storm: Secondary | ICD-10-CM | POA: Diagnosis not present

## 2018-03-04 DIAGNOSIS — I10 Essential (primary) hypertension: Secondary | ICD-10-CM | POA: Diagnosis not present

## 2018-03-04 DIAGNOSIS — R109 Unspecified abdominal pain: Secondary | ICD-10-CM | POA: Diagnosis not present

## 2018-03-11 DIAGNOSIS — I10 Essential (primary) hypertension: Secondary | ICD-10-CM | POA: Diagnosis not present

## 2018-03-11 DIAGNOSIS — R109 Unspecified abdominal pain: Secondary | ICD-10-CM | POA: Diagnosis not present

## 2018-03-11 DIAGNOSIS — E0590 Thyrotoxicosis, unspecified without thyrotoxic crisis or storm: Secondary | ICD-10-CM | POA: Diagnosis not present

## 2018-03-11 DIAGNOSIS — S32020D Wedge compression fracture of second lumbar vertebra, subsequent encounter for fracture with routine healing: Secondary | ICD-10-CM | POA: Diagnosis not present

## 2018-03-11 DIAGNOSIS — S22000D Wedge compression fracture of unspecified thoracic vertebra, subsequent encounter for fracture with routine healing: Secondary | ICD-10-CM | POA: Diagnosis not present

## 2018-03-18 ENCOUNTER — Ambulatory Visit (INDEPENDENT_AMBULATORY_CARE_PROVIDER_SITE_OTHER): Payer: Medicare Other | Admitting: Family Medicine

## 2018-03-18 ENCOUNTER — Encounter: Payer: Self-pay | Admitting: Family Medicine

## 2018-03-18 VITALS — BP 113/57 | HR 88

## 2018-03-18 DIAGNOSIS — S32020D Wedge compression fracture of second lumbar vertebra, subsequent encounter for fracture with routine healing: Secondary | ICD-10-CM | POA: Diagnosis not present

## 2018-03-18 DIAGNOSIS — S22000D Wedge compression fracture of unspecified thoracic vertebra, subsequent encounter for fracture with routine healing: Secondary | ICD-10-CM | POA: Diagnosis not present

## 2018-03-18 DIAGNOSIS — K582 Mixed irritable bowel syndrome: Secondary | ICD-10-CM

## 2018-03-18 DIAGNOSIS — I10 Essential (primary) hypertension: Secondary | ICD-10-CM | POA: Diagnosis not present

## 2018-03-18 DIAGNOSIS — R109 Unspecified abdominal pain: Secondary | ICD-10-CM | POA: Diagnosis not present

## 2018-03-18 DIAGNOSIS — S22000A Wedge compression fracture of unspecified thoracic vertebra, initial encounter for closed fracture: Secondary | ICD-10-CM

## 2018-03-18 DIAGNOSIS — E0590 Thyrotoxicosis, unspecified without thyrotoxic crisis or storm: Secondary | ICD-10-CM | POA: Diagnosis not present

## 2018-03-18 NOTE — Progress Notes (Signed)
Subjective:    CC: BP   HPI:  Hypertension- Pt denies chest pain, SOB, dizziness, or heart palpitations.  Taking meds as directed w/o problems.  Denies medication side effects.    She has been getting some assistance through home health as well.  They are actually supposed to be coming out 7 days a week but over the last couple of weeks they have only been coming out about 3 times a week.  They are assisting her with bathing and light housecleaning.  She is frustrated because she says when they come in they do a couple things and then they basically sit down and watch TV the rest of the time or call people on their cell phones.  Hyperthyroid - she is now on methimazole. Says she really doesn't feel much better. Has a f/u endocrinology on August 12th.  She has been trying to eat a little more regularly.  She did want to let me know that she actually vomited twice after eating.  She says sometimes it is hard to swallow food because she is unable to chew it because she has no teeth.  No significant nausea.  But sometimes after she eats she will also get diarrhea and if she is by herself she cannot get to the bathroom in time to make it.  She also has a compression fracture of the thoracic vertebrae.  She still having some pain more to the right side of her back.  It has been getting progressively better though when her brother tries to lift her up to put her into bed it is quite painful.  And she still not able to sleep on her side.  She is now on a vitamin D supplement.  Past medical history, Surgical history, Family history not pertinant except as noted below, Social history, Allergies, and medications have been entered into the medical record, reviewed, and corrections made.   Review of Systems: No fevers, chills, night sweats, weight loss, chest pain, or shortness of breath.   Objective:    General: Well Developed, well nourished, and in no acute distress.  Neuro: Alert and oriented x3,  extra-ocular muscles intact, sensation grossly intact.  HEENT: Normocephalic, atraumatic  Skin: Warm and dry, no rashes. Cardiac: Regular rate and rhythm, no murmurs rubs or gallops, no lower extremity edema.  Respiratory: Clear to auscultation bilaterally. Not using accessory muscles, speaking in full sentences.   Impression and Recommendations:    HTN - Well controlled. Continue current regimen. Follow up in  6months.  MP is up-to-date.  Hypothyroidism-currently on methimazole.  Has follow-up in about 3 weeks with endocrinology is just strongly encouraged her to keep that appointment and she will have labs drawn at that time.  Compression fracture of thoracic spine-she does seem to be getting better in regards to pain.  She is now on a vitamin D supplement.  IBS-continue dicyclomine.  Try to eat more regularly.

## 2018-03-25 DIAGNOSIS — E0590 Thyrotoxicosis, unspecified without thyrotoxic crisis or storm: Secondary | ICD-10-CM | POA: Diagnosis not present

## 2018-03-25 DIAGNOSIS — S22000D Wedge compression fracture of unspecified thoracic vertebra, subsequent encounter for fracture with routine healing: Secondary | ICD-10-CM | POA: Diagnosis not present

## 2018-03-25 DIAGNOSIS — R109 Unspecified abdominal pain: Secondary | ICD-10-CM | POA: Diagnosis not present

## 2018-03-25 DIAGNOSIS — I10 Essential (primary) hypertension: Secondary | ICD-10-CM | POA: Diagnosis not present

## 2018-03-25 DIAGNOSIS — S32020D Wedge compression fracture of second lumbar vertebra, subsequent encounter for fracture with routine healing: Secondary | ICD-10-CM | POA: Diagnosis not present

## 2018-03-30 ENCOUNTER — Telehealth: Payer: Self-pay

## 2018-03-30 NOTE — Telephone Encounter (Signed)
Vilma PraderAmanda Nelson, RN from [facility], requesting ok for orders to re-certify pt for filling her pill box weekly and also for incontinence supplies.    Verbal given for visits to fill pt's pill box weekly.   RX pended for incontinence supplies.   Waiting to Marchelle Folksmanda to call back and provide fax number to send RX for incontinence supplies

## 2018-04-01 NOTE — Telephone Encounter (Signed)
Left msg as follow up asking for fax number

## 2018-04-03 NOTE — Telephone Encounter (Signed)
Marchelle Folksmanda called back and a new script is not needed at this time. She will be faxing a new form over to the office to sign and send back per Research officer, trade unionAmanda's supervisor.

## 2018-04-06 DIAGNOSIS — R600 Localized edema: Secondary | ICD-10-CM | POA: Diagnosis not present

## 2018-04-06 DIAGNOSIS — E0590 Thyrotoxicosis, unspecified without thyrotoxic crisis or storm: Secondary | ICD-10-CM | POA: Diagnosis not present

## 2018-04-06 DIAGNOSIS — S22000S Wedge compression fracture of unspecified thoracic vertebra, sequela: Secondary | ICD-10-CM | POA: Diagnosis not present

## 2018-04-06 DIAGNOSIS — E559 Vitamin D deficiency, unspecified: Secondary | ICD-10-CM | POA: Diagnosis not present

## 2018-04-29 ENCOUNTER — Other Ambulatory Visit: Payer: Self-pay | Admitting: Family Medicine

## 2018-04-29 DIAGNOSIS — N390 Urinary tract infection, site not specified: Secondary | ICD-10-CM

## 2018-04-29 DIAGNOSIS — K589 Irritable bowel syndrome without diarrhea: Secondary | ICD-10-CM

## 2018-05-02 ENCOUNTER — Other Ambulatory Visit: Payer: Self-pay | Admitting: Family Medicine

## 2018-05-02 DIAGNOSIS — N3281 Overactive bladder: Secondary | ICD-10-CM

## 2018-06-11 NOTE — Progress Notes (Deleted)
Subjective:   Anna Bowen is a 57 y.o. female who presents for an Initial Medicare Annual Wellness Visit.  Review of Systems    No ROS.  Medicare Wellness Visit. Additional risk factors are reflected in the social history.     Sleep patterns:   Home Safety/Smoke Alarms: Feels safe in home. Smoke alarms in place.  Living environment;     Female:   Pap-       Mammo- utd      Dexa scan- not of age currently       CCS-     Objective:    There were no vitals filed for this visit. There is no height or weight on file to calculate BMI.  No flowsheet data found.  Current Medications (verified) Outpatient Encounter Medications as of 06/16/2018  Medication Sig  . acetaminophen (TYLENOL) 500 MG tablet Take 1 tablet (500 mg total) by mouth every 8 (eight) hours.  . ciclopirox (PENLAC) 8 % solution Apply topically at bedtime. Apply over nail and surrounding skin. After seven (7) days, may remove with alcohol and continue cycle.  . dicyclomine (BENTYL) 20 MG tablet TAKE 1 TABLET (20 MG TOTAL) BY MOUTH 2 (TWO) TIMES DAILY AS NEEDED.  Marland Kitchen lisinopril (PRINIVIL,ZESTRIL) 20 MG tablet TAKE 1 TABLET BY MOUTH EVERY DAY  . methimazole (TAPAZOLE) 10 MG tablet Take 20 mg by mouth daily.  . mupirocin ointment (BACTROBAN) 2 % APPLY TO AFFECTED AREA TWICE DAILY TO AREA ON THUMB  . nitrofurantoin (MACRODANTIN) 100 MG capsule TAKE 1 CAPSULE BY MOUTH EVERY DAY  . oxybutynin (DITROPAN) 5 MG tablet TAKE 1 TABLET BY MOUTH THREE TIMES A DAY  . Vitamin D, Ergocalciferol, (DRISDOL) 50000 units CAPS capsule Take 1 capsule (50,000 Units total) by mouth every 7 (seven) days.   No facility-administered encounter medications on file as of 06/16/2018.     Allergies (verified) Penicillins   History: Past Medical History:  Diagnosis Date  . Cerebral palsy (HCC)    since birth   No past surgical history on file. Family History  Problem Relation Age of Onset  . Cancer Father        brain tumor died  age 45  . Diabetes Brother   . Osteoporosis Mother   . Lung disease Mother        Restrictive\  . Hypertension Mother    Social History   Socioeconomic History  . Marital status: Single    Spouse name: Not on file  . Number of children: Not on file  . Years of education: Not on file  . Highest education level: Not on file  Occupational History  . Not on file  Social Needs  . Financial resource strain: Not on file  . Food insecurity:    Worry: Not on file    Inability: Not on file  . Transportation needs:    Medical: Not on file    Non-medical: Not on file  Tobacco Use  . Smoking status: Never Smoker  . Smokeless tobacco: Never Used  Substance and Sexual Activity  . Alcohol use: No  . Drug use: No  . Sexual activity: Never    Comment: diabled, no regular exercise.   Lifestyle  . Physical activity:    Days per week: Not on file    Minutes per session: Not on file  . Stress: Not on file  Relationships  . Social connections:    Talks on phone: Not on file    Gets together:  Not on file    Attends religious service: Not on file    Active member of club or organization: Not on file    Attends meetings of clubs or organizations: Not on file    Relationship status: Not on file  Other Topics Concern  . Not on file  Social History Narrative  . Not on file    Tobacco Counseling Counseling given: Not Answered   Clinical Intake:                        Activities of Daily Living No flowsheet data found.   Immunizations and Health Maintenance Immunization History  Administered Date(s) Administered  . Td 05/03/2009   Health Maintenance Due  Topic Date Due  . Hepatitis C Screening  May 28, 1961  . HIV Screening  01/20/1976  . PAP SMEAR  01/19/1982  . COLONOSCOPY  01/20/2011    Patient Care Team: Agapito Games, MD as PCP - General  Indicate any recent Medical Services you may have received from other than [facility] providers in the past year  (date may be approximate).     Assessment:   This is a routine wellness examination for [facility]Physical assessment deferred to PCP.   Hearing/Vision screen No exam data present  Dietary issues and exercise activities discussed:   Diet  Breakfast: Lunch:  Dinner:       Goals   None    Depression Screen PHQ 2/9 Scores 03/18/2018 12/15/2017 06/18/2017  PHQ - 2 Score 2 4 0  PHQ- 9 Score 3 14 -    Fall Risk Fall Risk  12/15/2017 06/18/2017  Falls in the past year? Exclusion - non ambulatory Yes  Number falls in past yr: - 1  Injury with Fall? - No  Risk for fall due to : - Impaired mobility  Follow up - Falls prevention discussed    Is the patient's home free of loose throw rugs in walkways, pet beds, electrical cords, etc?   {Blank single:19197::"yes","no"}      Grab bars in the bathroom? {Blank single:19197::"yes","no"}      Handrails on the stairs?   {Blank single:19197::"yes","no"}      Adequate lighting?   {Blank single:19197::"yes","no"}   Cognitive Function:        Screening Tests Health Maintenance  Topic Date Due  . Hepatitis C Screening  September 05, 1960  . HIV Screening  01/20/1976  . PAP SMEAR  01/19/1982  . COLONOSCOPY  01/20/2011  . INFLUENZA VACCINE  06/18/2018 (Originally 03/26/2018)  . MAMMOGRAM  12/12/2018  . TETANUS/TDAP  05/04/2019      Plan:   ***  I have personally reviewed and noted the following in the patient's chart:   . Medical and social history . Use of alcohol, tobacco or illicit drugs  . Current medications and supplements . Functional ability and status . Nutritional status . Physical activity . Advanced directives . List of other physicians . Hospitalizations, surgeries, and ER visits in previous 12 months . Vitals . Screenings to include cognitive, depression, and falls . Referrals and appointments  In addition, I have reviewed and discussed with patient certain preventive protocols, quality metrics, and best practice  recommendations. A written personalized care plan for preventive services as well as general preventive health recommendations were provided to patient.     Normand Sloop, LPN   [id number]

## 2018-06-16 ENCOUNTER — Ambulatory Visit: Payer: Medicare Other

## 2018-06-17 NOTE — Progress Notes (Signed)
Subjective:   Anna Bowen is a 57 y.o. female who presents for an Initial Medicare Annual Wellness Visit.  Review of Systems    No ROS.  Medicare Wellness Visit. Additional risk factors are reflected in the social history.  Sleep patterns: getting 5 hours of sleep if that at night. States sometimes brother forgets to put her in bed and sleeps in her wheelchair.  Home Safety/Smoke Alarms: Feels safe in home. Smoke alarms in place.  Living environment;Lives with brother in 1 story home. Seat Belt Safety/Bike Helmet: Wears seat belt.   Female:   Pap-   Aged out    Mammo-  utd    Dexa scan-   Pt can not physically do     CCS-Patient can not physically do  Cardiac Risk Factors include: hypertension;advanced age (>21men, >72 women);sedentary lifestyle    Objective:    Today's Vitals   06/29/18 1334  BP: (!) 105/59  Pulse: 71  SpO2: 100%   There is no height or weight on file to calculate BMI.  Advanced Directives 06/29/2018  Does Patient Have a Medical Advance Directive? No  Would patient like information on creating a medical advance directive? Yes (MAU/Ambulatory/Procedural Areas - Information given)    Current Medications (verified) Outpatient Encounter Medications as of 06/29/2018  Medication Sig  . acetaminophen (TYLENOL) 500 MG tablet Take 1 tablet (500 mg total) by mouth every 8 (eight) hours.  . ciclopirox (PENLAC) 8 % solution Apply topically at bedtime. Apply over nail and surrounding skin. After seven (7) days, may remove with alcohol and continue cycle.  . dicyclomine (BENTYL) 20 MG tablet TAKE 1 TABLET (20 MG TOTAL) BY MOUTH 2 (TWO) TIMES DAILY AS NEEDED.  Marland Kitchen lisinopril (PRINIVIL,ZESTRIL) 20 MG tablet TAKE 1 TABLET BY MOUTH EVERY DAY  . methimazole (TAPAZOLE) 10 MG tablet Take 20 mg by mouth daily.  . nitrofurantoin (MACRODANTIN) 100 MG capsule TAKE 1 CAPSULE BY MOUTH EVERY DAY  . oxybutynin (DITROPAN) 5 MG tablet TAKE 1 TABLET BY MOUTH THREE TIMES A DAY  .  Vitamin D, Ergocalciferol, (DRISDOL) 50000 units CAPS capsule Take 1 capsule (50,000 Units total) by mouth every 7 (seven) days.  . [DISCONTINUED] mupirocin ointment (BACTROBAN) 2 % APPLY TO AFFECTED AREA TWICE DAILY TO AREA ON THUMB (Patient not taking: Reported on 06/29/2018)   No facility-administered encounter medications on file as of 06/29/2018.     Allergies (verified) Penicillins   History: Past Medical History:  Diagnosis Date  . Cerebral palsy (HCC)    since birth  . Thyroid disease    History reviewed. No pertinent surgical history. Family History  Problem Relation Age of Onset  . Cancer Father        brain tumor died age 46  . Diabetes Brother   . Osteoporosis Mother   . Lung disease Mother        Restrictive\  . Hypertension Mother    Social History   Socioeconomic History  . Marital status: Single    Spouse name: Not on file  . Number of children: 0  . Years of education: 28  . Highest education level: 12th grade  Occupational History  . Occupation: disability    Comment: [employer]  Social Needs  . Financial resource strain: Not hard at all  . Food insecurity:    Worry: Never true    Inability: Never true  . Transportation needs:    Medical: No    Non-medical: No  Tobacco Use  .  Smoking status: Never Smoker  . Smokeless tobacco: Never Used  Substance and Sexual Activity  . Alcohol use: No  . Drug use: No  . Sexual activity: Never    Comment: diabled, no regular exercise.   Lifestyle  . Physical activity:    Days per week: 0 days    Minutes per session: 0 min  . Stress: Not at all  Relationships  . Social connections:    Talks on phone: Once a week    Gets together: Never    Attends religious service: Never    Active member of club or organization: No    Attends meetings of clubs or organizations: Never    Relationship status: Never married  Other Topics Concern  . Not on file  Social History Narrative   Does word searches at home to  help stimulate the brain. Caffeine 1 drink daily    Tobacco Counseling Counseling given: Not Answered   Clinical Intake:  Pre-visit preparation completed: Yes  Pain : No/denies pain     Nutritional Risks: None Diabetes: No  How often do you need to have someone help you when you read instructions, pamphlets, or other written materials from your doctor or pharmacy?: 3 - Sometimes What is the last grade level you completed in school?: 12  Interpreter Needed?: No  Information entered by :: Carolin Sicks, LPN   Activities of Daily Living In your present state of health, do you have any difficulty performing the following activities: 06/29/2018  Hearing? N  Vision? N  Difficulty concentrating or making decisions? N  Walking or climbing stairs? Y  Comment cerebral palsy  Dressing or bathing? Y  Comment needs assistance  Doing errands, shopping? N  Preparing Food and eating ? Y  Comment has cerbral palsy. Frances Furbish comes in the home to help her  Using the Toilet? Y  In the past six months, have you accidently leaked urine? Y  Comment wears depends  Do you have problems with loss of bowel control? N  Managing your Medications? Y  Managing your Finances? Y  Housekeeping or managing your Housekeeping? Y  Some recent data might be hidden     Immunizations and Health Maintenance Immunization History  Administered Date(s) Administered  . Td 05/03/2009   Health Maintenance Due  Topic Date Due  . Hepatitis C Screening  17-Feb-1961  . HIV Screening  01/20/1976  . PAP SMEAR  01/19/1982  . COLONOSCOPY  01/20/2011  . INFLUENZA VACCINE  03/26/2018    Patient Care Team: Agapito Games, MD as PCP - General  Indicate any recent Medical Services you may have received from other than [facility] providers in the past year (date may be approximate).     Assessment:   This is a routine wellness examination for [facility].Physical assessment deferred to PCP.   Hearing/Vision screen   Visual Acuity Screening   Right eye Left eye Both eyes  Without correction: 20/50 20/70 20/50   With correction:     Hearing Screening Comments: Patient was able to repeat all 3 words done on the whisper test  Dietary issues and exercise activities discussed: Current Exercise Habits: The patient does not participate in regular exercise at present, Exercise limited by: orthopedic condition(s);neurologic condition(s);Other - see comments(cerebral palsy) Diet has no teeth so gums most food. Not a vegetable or fruit eater. Breakfast: anything she can get her hands on Lunch: skips  Dinner: fast food usually during the week      Goals   None  Depression Screen PHQ 2/9 Scores 06/29/2018 03/18/2018 12/15/2017 06/18/2017  PHQ - 2 Score 0 2 4 0  PHQ- 9 Score - 3 14 -    Fall Risk Fall Risk  06/29/2018 12/15/2017 06/18/2017  Falls in the past year? 0 Exclusion - non ambulatory Yes  Number falls in past yr: - - 1  Injury with Fall? - - No  Risk for fall due to : Impaired mobility;Impaired balance/gait;Impaired vision - Impaired mobility  Follow up Falls prevention discussed - Falls prevention discussed    Is the patient's home free of loose throw rugs in walkways, pet beds, electrical cords, etc?   yes      Grab bars in the bathroom? yes      Handrails on the stairs?   no      Adequate lighting?   yes  Cognitive Function:     6CIT Screen 06/29/2018  What Year? 0 points  What month? 0 points  What time? 0 points  Count back from 20 0 points  Months in reverse 0 points  Repeat phrase 0 points  Total Score 0    Screening Tests Health Maintenance  Topic Date Due  . Hepatitis C Screening  19-Nov-1960  . HIV Screening  01/20/1976  . PAP SMEAR  01/19/1982  . COLONOSCOPY  01/20/2011  . INFLUENZA VACCINE  03/26/2018  . MAMMOGRAM  12/12/2018  . TETANUS/TDAP  05/04/2019      Plan:    Please schedule your next medicare wellness visit with me in 1 yr.  Ms. Nery , Thank you for  taking time to come for your Medicare Wellness Visit. I appreciate your ongoing commitment to your health goals. Please review the following plan we discussed and let me know if I can assist you in the future.  Continue doing brain stimulating activities (puzzles, reading, adult coloring books, staying active) to keep memory sharp.    These are the goals we discussed: Goals   None     This is a list of the screening recommended for you and due dates:  Health Maintenance  Topic Date Due  .  Hepatitis C: One time screening is recommended by Center for Disease Control  (CDC) for  adults born from [birth year] through 1965.   [birth year]  . HIV Screening  01/20/1976  . Pap Smear  01/19/1982  . Colon Cancer Screening  01/20/2011  . Flu Shot  03/26/2018  . Mammogram  12/12/2018  . Tetanus Vaccine  05/04/2019     I have personally reviewed and noted the following in the patient's chart:   . Medical and social history . Use of alcohol, tobacco or illicit drugs  . Current medications and supplements . Functional ability and status . Nutritional status . Physical activity . Advanced directives . List of other physicians . Hospitalizations, surgeries, and ER visits in previous 12 months . Vitals . Screenings to include cognitive, depression, and falls . Referrals and appointments  In addition, I have reviewed and discussed with patient certain preventive protocols, quality metrics, and best practice recommendations. A written personalized care plan for preventive services as well as general preventive health recommendations were provided to patient.     Normand Sloop, LPN   16/08/958

## 2018-06-29 ENCOUNTER — Ambulatory Visit (INDEPENDENT_AMBULATORY_CARE_PROVIDER_SITE_OTHER): Payer: Medicare Other | Admitting: *Deleted

## 2018-06-29 VITALS — BP 105/59 | HR 71

## 2018-06-29 DIAGNOSIS — Z0000 Encounter for general adult medical examination without abnormal findings: Secondary | ICD-10-CM

## 2018-06-29 NOTE — Patient Instructions (Addendum)
Please schedule your next medicare wellness visit with me in 1 yr.  Anna Bowen , Thank you for taking time to come for your Medicare Wellness Visit. I appreciate your ongoing commitment to your health goals. Please review the following plan we discussed and let me know if I can assist you in the future.  Continue doing brain stimulating activities (puzzles, reading, adult coloring books, staying active) to keep memory sharp.   Bring a copy of your living will and/or healthcare power of attorney to your next office visit.

## 2018-07-02 ENCOUNTER — Other Ambulatory Visit: Payer: Self-pay | Admitting: Family Medicine

## 2018-07-02 DIAGNOSIS — N390 Urinary tract infection, site not specified: Secondary | ICD-10-CM

## 2018-07-17 ENCOUNTER — Other Ambulatory Visit: Payer: Self-pay | Admitting: Family Medicine

## 2018-07-17 DIAGNOSIS — N390 Urinary tract infection, site not specified: Secondary | ICD-10-CM

## 2018-07-27 ENCOUNTER — Other Ambulatory Visit: Payer: Self-pay | Admitting: Family Medicine

## 2018-10-09 ENCOUNTER — Other Ambulatory Visit: Payer: Self-pay | Admitting: Family Medicine

## 2018-10-09 DIAGNOSIS — N390 Urinary tract infection, site not specified: Secondary | ICD-10-CM

## 2018-10-13 ENCOUNTER — Other Ambulatory Visit: Payer: Self-pay | Admitting: Family Medicine

## 2018-10-13 DIAGNOSIS — K589 Irritable bowel syndrome without diarrhea: Secondary | ICD-10-CM

## 2018-11-02 ENCOUNTER — Other Ambulatory Visit: Payer: Self-pay | Admitting: Family Medicine

## 2018-11-29 ENCOUNTER — Other Ambulatory Visit: Payer: Self-pay | Admitting: Family Medicine

## 2018-12-26 ENCOUNTER — Other Ambulatory Visit: Payer: Self-pay | Admitting: Family Medicine

## 2019-01-15 ENCOUNTER — Other Ambulatory Visit: Payer: Self-pay | Admitting: Family Medicine

## 2019-01-15 DIAGNOSIS — N390 Urinary tract infection, site not specified: Secondary | ICD-10-CM

## 2019-01-23 ENCOUNTER — Other Ambulatory Visit: Payer: Self-pay | Admitting: Family Medicine

## 2019-02-12 ENCOUNTER — Other Ambulatory Visit: Payer: Self-pay | Admitting: Family Medicine

## 2019-02-12 DIAGNOSIS — N3281 Overactive bladder: Secondary | ICD-10-CM

## 2019-02-19 ENCOUNTER — Telehealth: Payer: Self-pay | Admitting: *Deleted

## 2019-02-19 ENCOUNTER — Other Ambulatory Visit: Payer: Self-pay | Admitting: Family Medicine

## 2019-02-19 DIAGNOSIS — N3281 Overactive bladder: Secondary | ICD-10-CM

## 2019-02-19 DIAGNOSIS — K589 Irritable bowel syndrome without diarrhea: Secondary | ICD-10-CM

## 2019-02-19 DIAGNOSIS — N390 Urinary tract infection, site not specified: Secondary | ICD-10-CM

## 2019-02-19 NOTE — Telephone Encounter (Signed)
error 

## 2019-02-24 ENCOUNTER — Other Ambulatory Visit: Payer: Self-pay | Admitting: Family Medicine

## 2019-02-24 DIAGNOSIS — N3281 Overactive bladder: Secondary | ICD-10-CM

## 2019-03-04 ENCOUNTER — Encounter

## 2019-03-04 ENCOUNTER — Encounter: Payer: Self-pay | Admitting: Family Medicine

## 2019-03-04 ENCOUNTER — Ambulatory Visit (INDEPENDENT_AMBULATORY_CARE_PROVIDER_SITE_OTHER): Payer: Medicare Other | Admitting: Family Medicine

## 2019-03-04 DIAGNOSIS — N3281 Overactive bladder: Secondary | ICD-10-CM

## 2019-03-04 DIAGNOSIS — R1314 Dysphagia, pharyngoesophageal phase: Secondary | ICD-10-CM

## 2019-03-04 DIAGNOSIS — E0590 Thyrotoxicosis, unspecified without thyrotoxic crisis or storm: Secondary | ICD-10-CM

## 2019-03-04 DIAGNOSIS — N390 Urinary tract infection, site not specified: Secondary | ICD-10-CM

## 2019-03-04 DIAGNOSIS — I10 Essential (primary) hypertension: Secondary | ICD-10-CM | POA: Diagnosis not present

## 2019-03-04 DIAGNOSIS — G809 Cerebral palsy, unspecified: Secondary | ICD-10-CM

## 2019-03-04 MED ORDER — OXYBUTYNIN CHLORIDE 5 MG PO TABS: 5.0000 mg | ORAL_TABLET | Freq: Three times a day (TID) | ORAL | 3 refills | Status: DC

## 2019-03-04 MED ORDER — LANSOPRAZOLE 30 MG PO TBDD: 30.0000 mg | DELAYED_RELEASE_TABLET | ORAL | 1 refills | Status: DC

## 2019-03-04 MED ORDER — LISINOPRIL 20 MG PO TABS: 20.0000 mg | ORAL_TABLET | Freq: Every day | ORAL | 1 refills | Status: DC

## 2019-03-04 MED ORDER — NITROFURANTOIN MACROCRYSTAL 100 MG PO CAPS: ORAL_CAPSULE | ORAL | 1 refills | Status: DC

## 2019-03-04 NOTE — Progress Notes (Signed)
Virtual Visit via Telephone Note  I connected with Anna Bowen on 03/04/19 at  1:00 PM EDT by telephone and verified that I am speaking with the correct person using two identifiers.   I discussed the limitations, risks, security and privacy concerns of performing an evaluation and management service by telephone and the availability of in person appointments. I also discussed with the patient that there may be a patient responsible charge related to this service. The patient expressed understanding and agreed to proceed.  Pt was at home and I was in my office for the virtual visit.      Subjective:    CC: HTN    HPI:  Hypertension- Pt denies chest pain, SOB, dizziness, or heart palpitations.  Taking meds as directed w/o problems.  Denies medication side effects.    Says everytime she eats she feels like she is having reflux. She is now taking prevacid at that does seem to help. occ feels like food is getting stuck. Unable to tell me if closer to her throat or her stomach.    Overactive bladder-she is taking her oxybutynin 3 times a day.  She says it does make a big difference otherwise she tends to leak and have some incontinence issues and she is unable to clean herself up because usually usually at home in a wheelchair by herself all day because of her cerebral palsy.  Is homebound because of her cerebral palsy.  Past medical history, Surgical history, Family history not pertinant except as noted below, Social history, Allergies, and medications have been entered into the medical record, reviewed, and corrections made.   Review of Systems: No fevers, chills, night sweats, weight loss, chest pain, or shortness of breath.   Objective:    General: Speaking clearly in complete sentences without any shortness of breath.  Alert and oriented x3.  Normal judgment. No apparent acute distress.    Impression and Recommendations:    HTN - she is asymptomatic. She is taking her  medication.  I do need to get some up-to-date blood work on her it has been over a year she just has difficulty with transportation but says she will try to get there this summer to go ahead and place orders today.  GERD- continue with the prevacid.  Seems to help. Offered to refer her to GI for further work up but declined  Dysphagia -to refer her more for the dysphagia but she declined.  She says when she takes the Prevacid it seems to help so organ to keep an eye on it through the summer and she will let me know if it is getting worse.  OAB -  Doing well on oxybutynin.  Refill sent to pharmacy.    I discussed the assessment and treatment plan with the patient. The patient was provided an opportunity to ask questions and all were answered. The patient agreed with the plan and demonstrated an understanding of the instructions.   The patient was advised to call back or seek an in-person evaluation if the symptoms worsen or if the condition fails to improve as anticipated.  I provided 25 minutes of non-face-to-face time during this encounter.   Beatrice Lecher, MD

## 2019-04-09 ENCOUNTER — Other Ambulatory Visit: Payer: Self-pay | Admitting: Family Medicine

## 2019-04-09 DIAGNOSIS — K589 Irritable bowel syndrome without diarrhea: Secondary | ICD-10-CM

## 2019-04-09 DIAGNOSIS — N390 Urinary tract infection, site not specified: Secondary | ICD-10-CM

## 2019-05-21 ENCOUNTER — Telehealth: Payer: Self-pay

## 2019-05-21 NOTE — Telephone Encounter (Signed)
Anna Bowen with Home Health called to see if Dr Madilyn Fireman could refill her Methimazole. She states she has been unable to get a ride to her appointments with Endocrinology. Please advise.

## 2019-05-21 NOTE — Telephone Encounter (Signed)
Has she called endocrinology.  Has she asked to do a telephone visit with them?

## 2019-05-24 NOTE — Telephone Encounter (Signed)
Left Amanda VM advising to reach out to Endo to get patient set up for virtual appt for refill

## 2019-07-16 ENCOUNTER — Other Ambulatory Visit: Payer: Self-pay | Admitting: Family Medicine

## 2019-07-16 DIAGNOSIS — N390 Urinary tract infection, site not specified: Secondary | ICD-10-CM

## 2019-07-29 ENCOUNTER — Other Ambulatory Visit: Payer: Self-pay | Admitting: Family Medicine

## 2019-07-29 DIAGNOSIS — Z1231 Encounter for screening mammogram for malignant neoplasm of breast: Secondary | ICD-10-CM

## 2019-08-26 ENCOUNTER — Other Ambulatory Visit: Payer: Self-pay | Admitting: Family Medicine

## 2019-09-09 IMAGING — CT CT ABD-PELV W/O CM
2 of 7 series · 16 of 46 positions shown, 18 images · non-contrast
Comparison: Radiographs November 11, 2017.

CLINICAL DATA: Right flank pain.

EXAM:
CT ABDOMEN AND PELVIS WITHOUT CONTRAST
TECHNIQUE: Multidetector CT imaging of the abdomen and pelvis was performed
following the standard protocol without IV contrast.

[Series 2: axial st · axial · 0.62mm/px · z∈[-298,+8]mm · 13 of 69 slices shown, 15 images]
[im 4/69  soft-tissue]
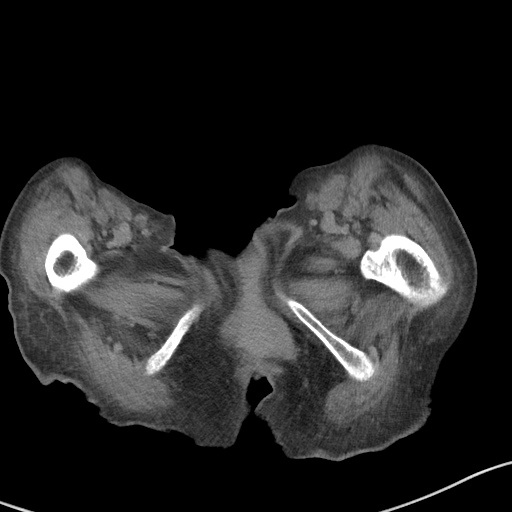
[im 4/69  bone]
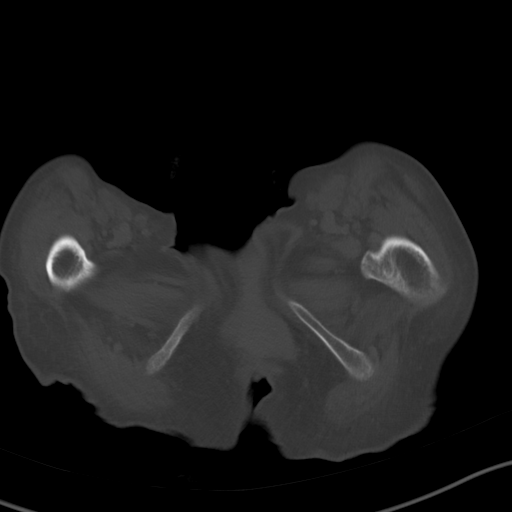
[im 11/69  soft-tissue]
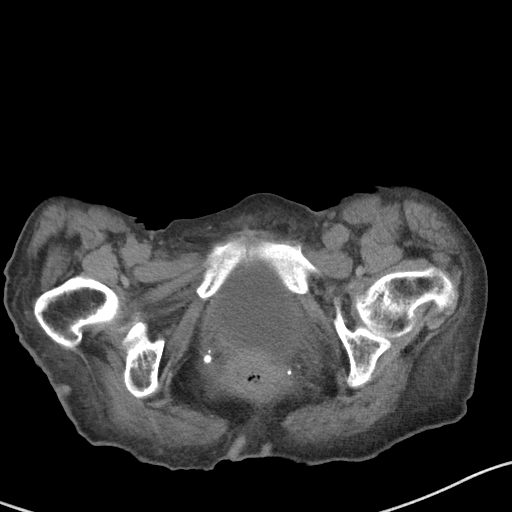
[im 15/69  soft-tissue]
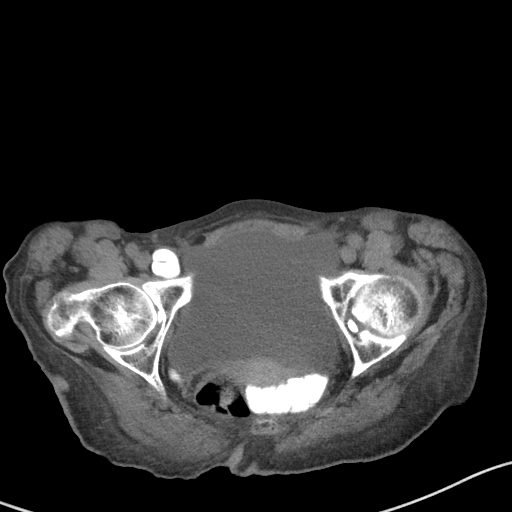
[im 18/69  soft-tissue]
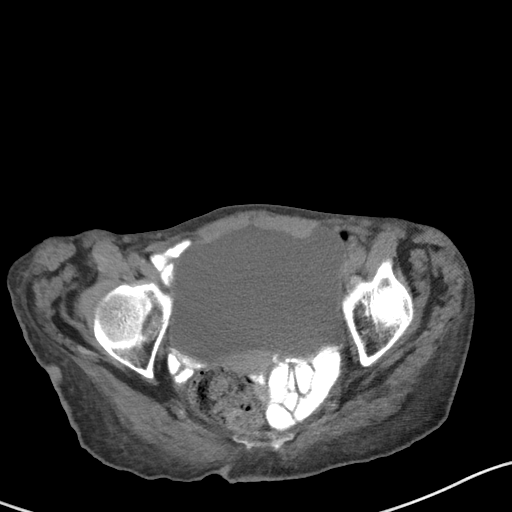
[im 26/69  soft-tissue]
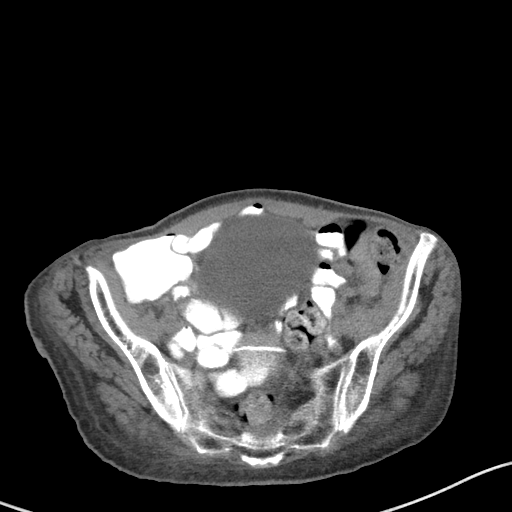
[im 29/69  soft-tissue]
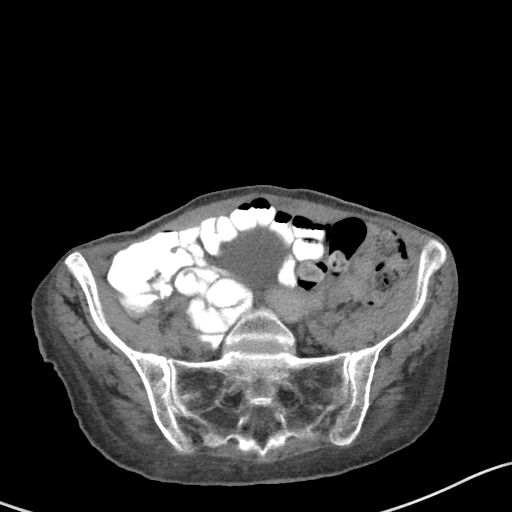
[im 36/69  soft-tissue]
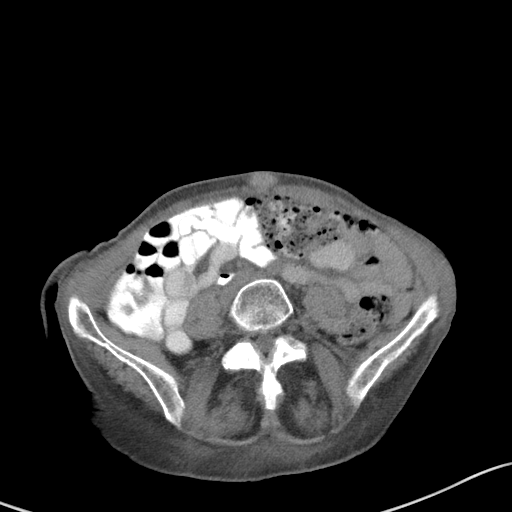
[im 40/69  soft-tissue]
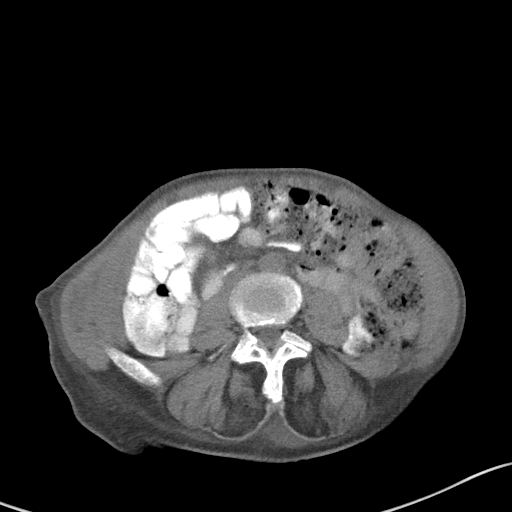
[im 43/69  soft-tissue]
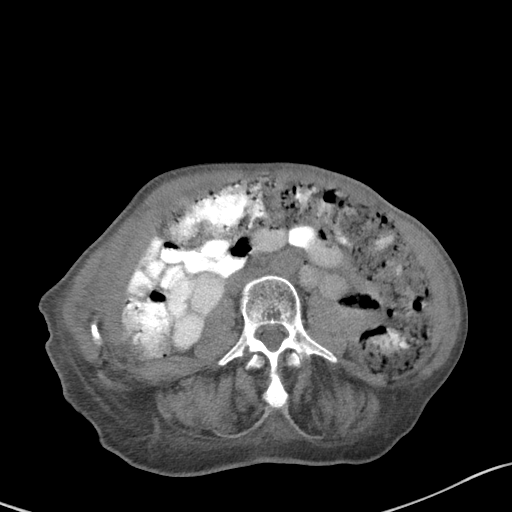
[im 43/69  bone]
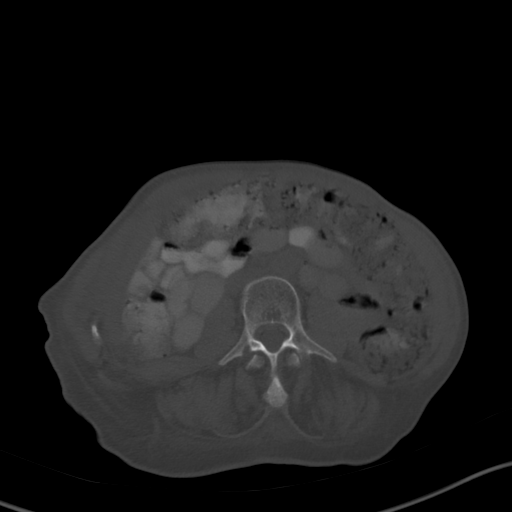
[im 51/69  soft-tissue]
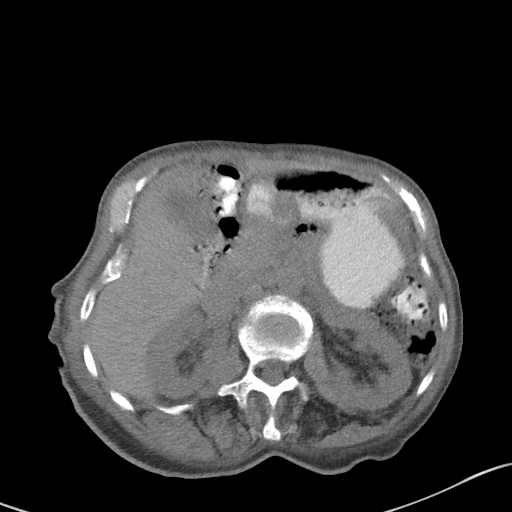
[im 54/69  soft-tissue]
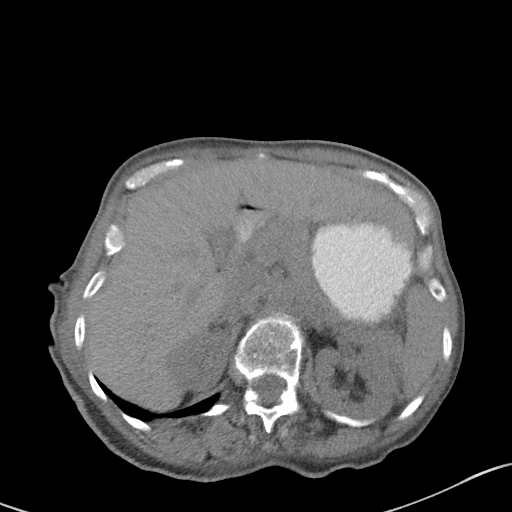
[im 58/69  soft-tissue]
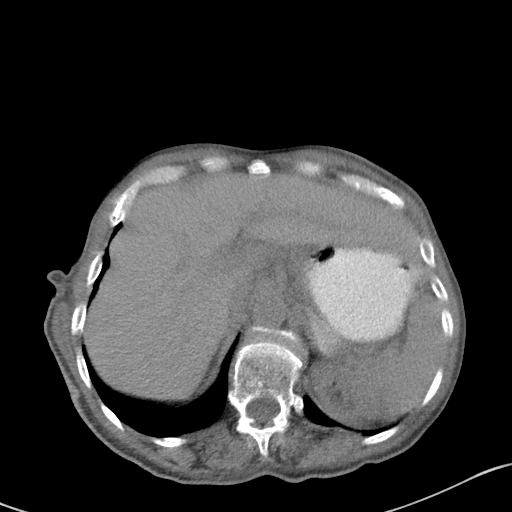
[im 65/69  soft-tissue]
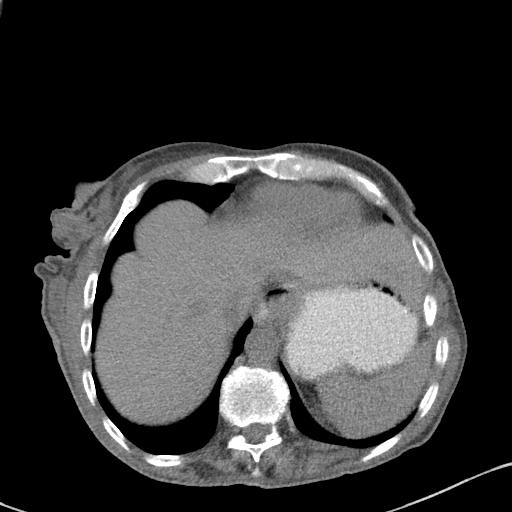

[Series 5: coronal st · coronal · 0.58mm/px · 3 of 73 slices shown]
[im 19/73  soft-tissue]
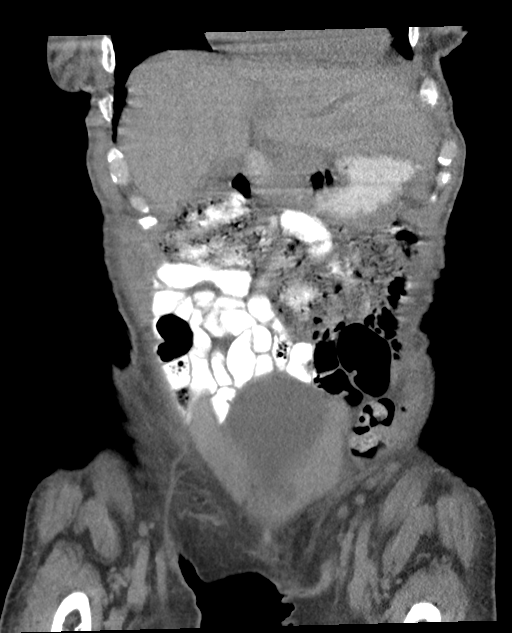
[im 37/73  soft-tissue]
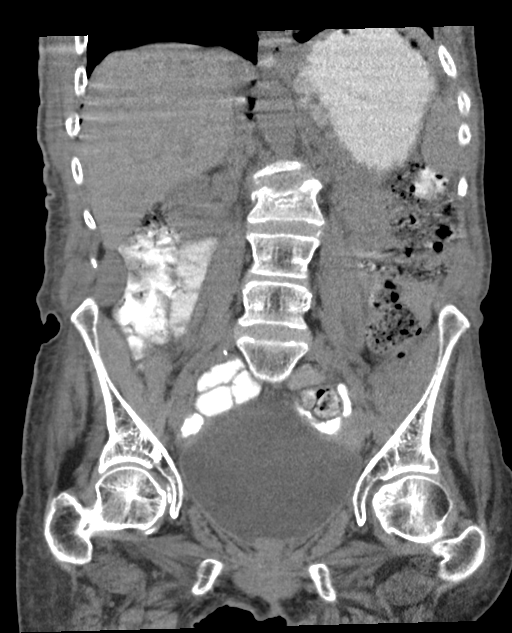
[im 55/73  soft-tissue]
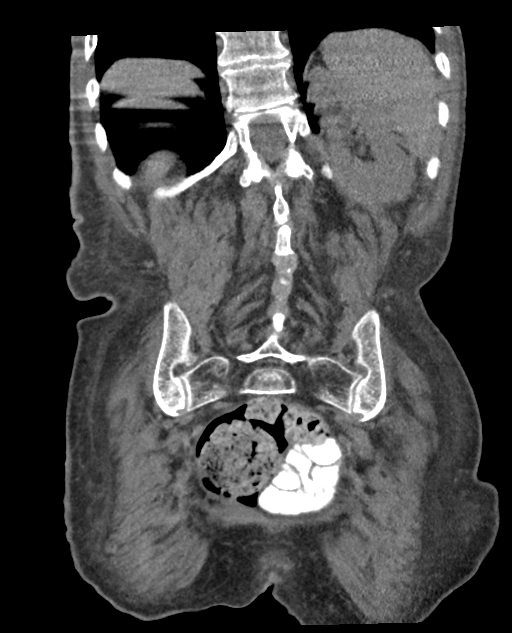

[16 of 46 positions shown; findings below may reference images not displayed]

FINDINGS: Lower chest: No acute abnormality.

Hepatobiliary: No focal liver abnormality is seen. No gallstones,
gallbladder wall thickening, or biliary dilatation.

Pancreas: Unremarkable. No pancreatic ductal dilatation or
surrounding inflammatory changes.

Spleen: Normal in size without focal abnormality.

Adrenals/Urinary Tract: Adrenal glands and kidneys appear normal. No
hydronephrosis or renal obstruction is noted. No renal or ureteral
calculi are noted. Moderate urinary bladder distention is noted.

Stomach/Bowel: The stomach appears normal. There is no evidence of
bowel obstruction or inflammation. The appendix is not visualized.
Stool is noted throughout the colon.

Vascular/Lymphatic: No significant vascular findings are present. No
enlarged abdominal or pelvic lymph nodes.

Reproductive: Prostate is unremarkable.

Other: No abdominal wall hernia or abnormality. No abdominopelvic
ascites.

Musculoskeletal: Moderate L2 compression fracture is noted of
indeterminate age.
IMPRESSION: Stool is noted throughout the colon.

Moderate urinary bladder distention is noted.

No evidence of hydronephrosis or renal obstruction is noted. No
renal or ureteral calculi are noted.

Continued presence of L2 compression fracture is noted of
indeterminate age. Further evaluation with MRI is recommended.

## 2019-09-10 DIAGNOSIS — G809 Cerebral palsy, unspecified: Secondary | ICD-10-CM | POA: Diagnosis not present

## 2019-09-10 DIAGNOSIS — I10 Essential (primary) hypertension: Secondary | ICD-10-CM | POA: Diagnosis not present

## 2019-09-10 DIAGNOSIS — E0590 Thyrotoxicosis, unspecified without thyrotoxic crisis or storm: Secondary | ICD-10-CM | POA: Diagnosis not present

## 2019-09-14 DIAGNOSIS — I10 Essential (primary) hypertension: Secondary | ICD-10-CM | POA: Diagnosis not present

## 2019-09-14 DIAGNOSIS — E0590 Thyrotoxicosis, unspecified without thyrotoxic crisis or storm: Secondary | ICD-10-CM | POA: Diagnosis not present

## 2019-09-14 DIAGNOSIS — G809 Cerebral palsy, unspecified: Secondary | ICD-10-CM | POA: Diagnosis not present

## 2019-09-14 NOTE — Progress Notes (Deleted)
Subjective:   Anna Bowen is a 59 y.o. female who presents for Medicare Annual (Subsequent) preventive examination.  Review of Systems:  No ROS.  Medicare Wellness Virtual Visit.  Visual/audio telehealth visit, UTA vital signs.   See social history for additional risk factors.      Sleep patterns:    Home Safety/Smoke Alarms: Feels safe in home. Smoke alarms in place.  Living environment;  Seat Belt Safety/Bike Helmet: Wears seat belt.   Female:   Pap-       Mammo-       Dexa scan-  Not eligible      CCS-    Objective:     Vitals: There were no vitals taken for this visit.  There is no height or weight on file to calculate BMI.  Advanced Directives 06/29/2018  Does Patient Have a Medical Advance Directive? No  Would patient like information on creating a medical advance directive? Yes (MAU/Ambulatory/Procedural Areas - Information given)    Tobacco Social History   Tobacco Use  Smoking Status Never Smoker  Smokeless Tobacco Never Used     Counseling given: Not Answered   Clinical Intake:                       Past Medical History:  Diagnosis Date  . Cerebral palsy (HCC)    since birth  . Thyroid disease    No past surgical history on file. Family History  Problem Relation Age of Onset  . Cancer Father        brain tumor died age 61  . Diabetes Brother   . Osteoporosis Mother   . Lung disease Mother        Restrictive\  . Hypertension Mother    Social History   Socioeconomic History  . Marital status: Single    Spouse name: Not on file  . Number of children: 0  . Years of education: 33  . Highest education level: 12th grade  Occupational History  . Occupation: disability    Comment: [employer]  Tobacco Use  . Smoking status: Never Smoker  . Smokeless tobacco: Never Used  Substance and Sexual Activity  . Alcohol use: No  . Drug use: No  . Sexual activity: Never    Comment: diabled, no regular exercise.   Other Topics  Concern  . Not on file  Social History Narrative   Does word searches at home to help stimulate the brain. Caffeine 1 drink daily   Social Determinants of Health   Financial Resource Strain:   . Difficulty of Paying Living Expenses: Not on file  Food Insecurity:   . Worried About Programme researcher, broadcasting/film/video in the Last Year: Not on file  . Ran Out of Food in the Last Year: Not on file  Transportation Needs:   . Lack of Transportation (Medical): Not on file  . Lack of Transportation (Non-Medical): Not on file  Physical Activity:   . Days of Exercise per Week: Not on file  . Minutes of Exercise per Session: Not on file  Stress:   . Feeling of Stress : Not on file  Social Connections:   . Frequency of Communication with Friends and Family: Not on file  . Frequency of Social Gatherings with Friends and Family: Not on file  . Attends Religious Services: Not on file  . Active Member of Clubs or Organizations: Not on file  . Attends Banker Meetings: Not on  file  . Marital Status: Not on file    Outpatient Encounter Medications as of 09/20/2019  Medication Sig  . acetaminophen (TYLENOL) 500 MG tablet Take 1 tablet (500 mg total) by mouth every 8 (eight) hours.  Marland Kitchen dicyclomine (BENTYL) 20 MG tablet TAKE 1 TABLET (20 MG TOTAL) BY MOUTH 2 (TWO) TIMES DAILY AS NEEDED.  Marland Kitchen lansoprazole (PREVACID SOLUTAB) 30 MG disintegrating tablet Take 1 tablet (30 mg total) by mouth every morning.  Marland Kitchen lisinopril (ZESTRIL) 20 MG tablet TAKE 1 TABLET BY MOUTH EVERY DAY  . methimazole (TAPAZOLE) 5 MG tablet Take 5 mg by mouth daily.  . nitrofurantoin (MACRODANTIN) 100 MG capsule TAKE 1 CAPSULE BY MOUTH EVERY DAY  . oxybutynin (DITROPAN) 5 MG tablet Take 1 tablet (5 mg total) by mouth 3 (three) times daily.  . Vitamin D, Ergocalciferol, (DRISDOL) 50000 units CAPS capsule Take 1 capsule (50,000 Units total) by mouth every 7 (seven) days.   No facility-administered encounter medications on file as of  09/20/2019.    Activities of Daily Living No flowsheet data found.  Patient Care Team: Hali Marry, MD as PCP - General    Assessment:   This is a routine wellness examination for [facility].Physical assessment deferred to PCP.   Exercise Activities and Dietary recommendations   Diet  Breakfast: Lunch:  Dinner:       Goals   None     Fall Risk Fall Risk  03/04/2019 06/29/2018 12/15/2017 06/18/2017  Falls in the past year? Exclusion - non ambulatory 0 Exclusion - non ambulatory Yes  Number falls in past yr: - - - 1  Injury with Fall? - - - No  Risk for fall due to : - Impaired mobility;Impaired balance/gait;Impaired vision - Impaired mobility  Follow up - Falls prevention discussed - Falls prevention discussed   Is the patient's home free of loose throw rugs in walkways, pet beds, electrical cords, etc?   {Blank single:19197::"yes","no"}      Grab bars in the bathroom? {Blank single:19197::"yes","no"}      Handrails on the stairs?   {Blank single:19197::"yes","no"}      Adequate lighting?   {Blank single:19197::"yes","no"}   Depression Screen PHQ 2/9 Scores 03/04/2019 06/29/2018 03/18/2018 12/15/2017  PHQ - 2 Score 1 0 2 4  PHQ- 9 Score - - 3 14     Cognitive Function     6CIT Screen 06/29/2018  What Year? 0 points  What month? 0 points  What time? 0 points  Count back from 20 0 points  Months in reverse 0 points  Repeat phrase 0 points  Total Score 0    Immunization History  Administered Date(s) Administered  . Td 05/03/2009    Screening Tests Health Maintenance  Topic Date Due  . Hepatitis C Screening  1961-03-31  . HIV Screening  01/20/1976  . COLONOSCOPY  01/20/2011  . MAMMOGRAM  12/12/2018  . INFLUENZA VACCINE  03/27/2019  . TETANUS/TDAP  05/04/2019  . PAP SMEAR-Modifier  09/05/2023 (Originally 01/19/1982)      Plan:   ***   I have personally reviewed and noted the following in the patient's chart:   . Medical and social history . Use of  alcohol, tobacco or illicit drugs  . Current medications and supplements . Functional ability and status . Nutritional status . Physical activity . Advanced directives . List of other physicians . Hospitalizations, surgeries, and ER visits in previous 12 months . Vitals . Screenings to include cognitive, depression, and falls . Referrals  and appointments  In addition, I have reviewed and discussed with patient certain preventive protocols, quality metrics, and best practice recommendations. A written personalized care plan for preventive services as well as general preventive health recommendations were provided to patient.     Normand Sloop, LPN  5/80/9983

## 2019-09-24 DIAGNOSIS — G809 Cerebral palsy, unspecified: Secondary | ICD-10-CM | POA: Diagnosis not present

## 2019-09-24 DIAGNOSIS — I10 Essential (primary) hypertension: Secondary | ICD-10-CM | POA: Diagnosis not present

## 2019-09-24 DIAGNOSIS — E0590 Thyrotoxicosis, unspecified without thyrotoxic crisis or storm: Secondary | ICD-10-CM | POA: Diagnosis not present

## 2019-10-04 NOTE — Progress Notes (Deleted)
Subjective:   Anna Bowen is a 59 y.o. female who presents for Medicare Annual (Subsequent) preventive examination.  Review of Systems:  No ROS.  Medicare Wellness Virtual Visit.  Visual/audio telehealth visit, UTA vital signs.   See social history for additional risk factors.      Sleep patterns:    Home Safety/Smoke Alarms: Feels safe in home. Smoke alarms in place.  Living environment; Seat Belt Safety/Bike Helmet: Wears seat belt.   Female:   Pap-       Mammo-       Dexa scan- Not eligible due to age       61-     Objective:     Vitals: There were no vitals taken for this visit.  There is no height or weight on file to calculate BMI.  Advanced Directives 06/29/2018  Does Patient Have a Medical Advance Directive? No  Would patient like information on creating a medical advance directive? Yes (MAU/Ambulatory/Procedural Areas - Information given)    Tobacco Social History   Tobacco Use  Smoking Status Never Smoker  Smokeless Tobacco Never Used     Counseling given: Not Answered   Clinical Intake:                       Past Medical History:  Diagnosis Date  . Cerebral palsy ([place])    since birth  . Thyroid disease    No past surgical history on file. Family History  Problem Relation Age of Onset  . Cancer Father        brain tumor died age 25  . Diabetes Brother   . Osteoporosis Mother   . Lung disease Mother        Restrictive\  . Hypertension Mother    Social History   Socioeconomic History  . Marital status: Single    Spouse name: Not on file  . Number of children: 0  . Years of education: 71  . Highest education level: 12th grade  Occupational History  . Occupation: disability    Comment: [employer]  Tobacco Use  . Smoking status: Never Smoker  . Smokeless tobacco: Never Used  Substance and Sexual Activity  . Alcohol use: No  . Drug use: No  . Sexual activity: Never    Comment: diabled, no regular exercise.    Other Topics Concern  . Not on file  Social History Narrative   Does word searches at home to help stimulate the brain. Caffeine 1 drink daily   Social Determinants of Health   Financial Resource Strain:   . Difficulty of Paying Living Expenses: Not on file  Food Insecurity:   . Worried About Charity fundraiser in the Last Year: Not on file  . Ran Out of Food in the Last Year: Not on file  Transportation Needs:   . Lack of Transportation (Medical): Not on file  . Lack of Transportation (Non-Medical): Not on file  Physical Activity:   . Days of Exercise per Week: Not on file  . Minutes of Exercise per Session: Not on file  Stress:   . Feeling of Stress : Not on file  Social Connections:   . Frequency of Communication with Friends and Family: Not on file  . Frequency of Social Gatherings with Friends and Family: Not on file  . Attends Religious Services: Not on file  . Active Member of Clubs or Organizations: Not on file  . Attends Archivist  Meetings: Not on file  . Marital Status: Not on file    Outpatient Encounter Medications as of 10/12/2019  Medication Sig  . acetaminophen (TYLENOL) 500 MG tablet Take 1 tablet (500 mg total) by mouth every 8 (eight) hours.  Marland Kitchen dicyclomine (BENTYL) 20 MG tablet TAKE 1 TABLET (20 MG TOTAL) BY MOUTH 2 (TWO) TIMES DAILY AS NEEDED.  Marland Kitchen lansoprazole (PREVACID SOLUTAB) 30 MG disintegrating tablet Take 1 tablet (30 mg total) by mouth every morning.  Marland Kitchen lisinopril (ZESTRIL) 20 MG tablet TAKE 1 TABLET BY MOUTH EVERY DAY  . methimazole (TAPAZOLE) 5 MG tablet Take 5 mg by mouth daily.  . nitrofurantoin (MACRODANTIN) 100 MG capsule TAKE 1 CAPSULE BY MOUTH EVERY DAY  . oxybutynin (DITROPAN) 5 MG tablet Take 1 tablet (5 mg total) by mouth 3 (three) times daily.  . Vitamin D, Ergocalciferol, (DRISDOL) 50000 units CAPS capsule Take 1 capsule (50,000 Units total) by mouth every 7 (seven) days.   No facility-administered encounter medications on  file as of 10/12/2019.    Activities of Daily Living No flowsheet data found.  Patient Care Team: Agapito Games, MD as PCP - General    Assessment:   This is a routine wellness examination for [name].Physical assessment deferred to PCP.   Exercise Activities and Dietary recommendations   Diet  Breakfast: Lunch:  Dinner:       Goals   None     Fall Risk Fall Risk  03/04/2019 06/29/2018 12/15/2017 06/18/2017  Falls in the past year? Exclusion - non ambulatory 0 Exclusion - non ambulatory Yes  Number falls in past yr: - - - 1  Injury with Fall? - - - No  Risk for fall due to : - Impaired mobility;Impaired balance/gait;Impaired vision - Impaired mobility  Follow up - Falls prevention discussed - Falls prevention discussed   Is the patient's home free of loose throw rugs in walkways, pet beds, electrical cords, etc?   {Blank single:19197::"yes","no"}      Grab bars in the bathroom? {Blank single:19197::"yes","no"}      Handrails on the stairs?   {Blank single:19197::"yes","no"}      Adequate lighting?   {Blank single:19197::"yes","no"}   Depression Screen PHQ 2/9 Scores 03/04/2019 06/29/2018 03/18/2018 12/15/2017  PHQ - 2 Score 1 0 2 4  PHQ- 9 Score - - 3 14     Cognitive Function     6CIT Screen 06/29/2018  What Year? 0 points  What month? 0 points  What time? 0 points  Count back from 20 0 points  Months in reverse 0 points  Repeat phrase 0 points  Total Score 0    Immunization History  Administered Date(s) Administered  . Td 05/03/2009    Screening Tests Health Maintenance  Topic Date Due  . Hepatitis C Screening  08-15-1961  . HIV Screening  01/20/1976  . COLONOSCOPY  01/20/2011  . MAMMOGRAM  12/12/2018  . INFLUENZA VACCINE  03/27/2019  . TETANUS/TDAP  05/04/2019  . PAP SMEAR-Modifier  09/05/2023 (Originally 01/19/1982)        Plan:   ***   I have personally reviewed and noted the following in the patient's chart:   . Medical and social  history . Use of alcohol, tobacco or illicit drugs  . Current medications and supplements . Functional ability and status . Nutritional status . Physical activity . Advanced directives . List of other physicians . Hospitalizations, surgeries, and ER visits in previous 12 months . Vitals . Screenings to include cognitive,  depression, and falls . Referrals and appointments  In addition, I have reviewed and discussed with patient certain preventive protocols, quality metrics, and best practice recommendations. A written personalized care plan for preventive services as well as general preventive health recommendations were provided to patient.     Normand Sloop, LPN  10/31/962

## 2019-10-08 ENCOUNTER — Other Ambulatory Visit: Payer: Self-pay | Admitting: Family Medicine

## 2019-10-08 DIAGNOSIS — N390 Urinary tract infection, site not specified: Secondary | ICD-10-CM

## 2019-10-08 DIAGNOSIS — K589 Irritable bowel syndrome without diarrhea: Secondary | ICD-10-CM

## 2019-10-18 NOTE — Progress Notes (Signed)
Subjective:   Anna Bowen is a 59 y.o. female who presents for Medicare Annual (Subsequent) preventive examination.  Review of Systems:  No ROS.  Medicare Wellness Virtual Visit.  Visual/audio telehealth visit, UTA vital signs.   See social history for additional risk factors.    Cardiac Risk Factors include: advanced age (>62men, >64 women);sedentary lifestyle;Other (see comment), Risk factor comments: cerebral palsy Sleep patterns: Getting 7 hours of sleep a night. Wakes up and feels rested. Home Safety/Smoke Alarms: Feels safe in home. Smoke alarms in place.  Living environment; Lives with brother in a 1 story home and no stairs in or around the home.  Shower is a step over tub combo and she states she takes "wash Ups". Seat Belt Safety/Bike Helmet: Wears seat belt.   Female:   Pap-   declined    Mammo- declined      Dexa scan- not eligible due to age        CCS-declined    Objective:     Vitals: There were no vitals taken for this visit.  There is no height or weight on file to calculate BMI.  Advanced Directives 10/26/2019 06/29/2018  Does Patient Have a Medical Advance Directive? No No  Would patient like information on creating a medical advance directive? No - Patient declined Yes (MAU/Ambulatory/Procedural Areas - Information given)    Tobacco Social History   Tobacco Use  Smoking Status Never Smoker  Smokeless Tobacco Never Used     Counseling given: Not Answered   Clinical Intake:  Pre-visit preparation completed: Yes  Pain : No/denies pain     Nutritional Risks: None Diabetes: No  How often do you need to have someone help you when you read instructions, pamphlets, or other written materials from your doctor or pharmacy?: 1 - Never What is the last grade level you completed in school?: 12     Information entered by :: Carolin Sicks, LPN  Past Medical History:  Diagnosis Date  . Cerebral palsy (HCC)    since birth  . Thyroid disease     History reviewed. No pertinent surgical history. Family History  Problem Relation Age of Onset  . Cancer Father        brain tumor died age 56  . Diabetes Brother   . Osteoporosis Mother   . Lung disease Mother        Restrictive\  . Hypertension Mother    Social History   Socioeconomic History  . Marital status: Single    Spouse name: Not on file  . Number of children: 0  . Years of education: 16  . Highest education level: 12th grade  Occupational History  . Occupation: disability    Comment: [employer]  Tobacco Use  . Smoking status: Never Smoker  . Smokeless tobacco: Never Used  Substance and Sexual Activity  . Alcohol use: No  . Drug use: No  . Sexual activity: Never    Comment: diabled, no regular exercise.   Other Topics Concern  . Not on file  Social History Narrative   Does word searches at home to help stimulate the brain. Caffeine 1 drink daily   Social Determinants of Health   Financial Resource Strain:   . Difficulty of Paying Living Expenses: Not on file  Food Insecurity:   . Worried About Programme researcher, broadcasting/film/video in the Last Year: Not on file  . Ran Out of Food in the Last Year: Not on file  Transportation Needs:   .  Lack of Transportation (Medical): Not on file  . Lack of Transportation (Non-Medical): Not on file  Physical Activity:   . Days of Exercise per Week: Not on file  . Minutes of Exercise per Session: Not on file  Stress:   . Feeling of Stress : Not on file  Social Connections:   . Frequency of Communication with Friends and Family: Not on file  . Frequency of Social Gatherings with Friends and Family: Not on file  . Attends Religious Services: Not on file  . Active Member of Clubs or Organizations: Not on file  . Attends Archivist Meetings: Not on file  . Marital Status: Not on file    Outpatient Encounter Medications as of 10/26/2019  Medication Sig  . acetaminophen (TYLENOL) 500 MG tablet Take 1 tablet (500 mg total) by  mouth every 8 (eight) hours.  Marland Kitchen dicyclomine (BENTYL) 20 MG tablet TAKE 1 TABLET (20 MG TOTAL) BY MOUTH 2 (TWO) TIMES DAILY AS NEEDED.  Marland Kitchen lansoprazole (PREVACID SOLUTAB) 30 MG disintegrating tablet Take 1 tablet (30 mg total) by mouth every morning.  Marland Kitchen lisinopril (ZESTRIL) 20 MG tablet TAKE 1 TABLET BY MOUTH EVERY DAY  . methimazole (TAPAZOLE) 5 MG tablet Take 5 mg by mouth daily.  . nitrofurantoin (MACRODANTIN) 100 MG capsule TAKE 1 CAPSULE BY MOUTH EVERY DAY  . oxybutynin (DITROPAN) 5 MG tablet Take 1 tablet (5 mg total) by mouth 3 (three) times daily.  . Vitamin D, Ergocalciferol, (DRISDOL) 50000 units CAPS capsule Take 1 capsule (50,000 Units total) by mouth every 7 (seven) days.   No facility-administered encounter medications on file as of 10/26/2019.    Activities of Daily Living In your present state of health, do you have any difficulty performing the following activities: 10/26/2019  Hearing? N  Vision? N  Difficulty concentrating or making decisions? N  Walking or climbing stairs? Y  Comment in a wheel chair  Dressing or bathing? Y  Comment nurse aides help with this task  Doing errands, shopping? Y  Comment brother helps with this  Preparing Food and eating ? N  Using the Toilet? N  In the past six months, have you accidently leaked urine? N  Do you have problems with loss of bowel control? N  Managing your Medications? Y  Comment nurse aide helps with this  Managing your Finances? Y  Comment brother helps  Housekeeping or managing your Housekeeping? Y  Comment brother helps do this  Some recent data might be hidden    Patient Care Team: Hali Marry, MD as PCP - General    Assessment:   This is a routine wellness examination for [facility].Physical assessment deferred to PCP.   Exercise Activities and Dietary recommendations Current Exercise Habits: The patient does not participate in regular exercise at present, Exercise limited by: neurologic  condition(s);orthopedic condition(s) Diet  Eats out a lot since she depends on her brother for all her assistance while he is at home. Breakfast: Berniece Salines, eggs Lunch: skips Dinner: hamburger or chicken      Goals    . Patient Stated     To try and start eating healthier.       Fall Risk Fall Risk  10/26/2019 03/04/2019 06/29/2018 12/15/2017 06/18/2017  Falls in the past year? 0 Exclusion - non ambulatory 0 Exclusion - non ambulatory Yes  Number falls in past yr: - - - - 1  Injury with Fall? - - - - No  Risk for fall due to :  Impaired mobility;Impaired balance/gait;History of fall(s) - Impaired mobility;Impaired balance/gait;Impaired vision - Impaired mobility  Follow up Falls prevention discussed;Education provided - Falls prevention discussed - Falls prevention discussed   Is the patient's home free of loose throw rugs in walkways, pet beds, electrical cords, etc?   yes      Grab bars in the bathroom? no      Handrails on the stairs?   no      Adequate lighting?   yes   Depression Screen PHQ 2/9 Scores 10/26/2019 03/04/2019 06/29/2018 03/18/2018  PHQ - 2 Score 0 1 0 2  PHQ- 9 Score - - - 3     Cognitive Function     6CIT Screen 10/26/2019 06/29/2018  What Year? 0 points 0 points  What month? 0 points 0 points  What time? 0 points 0 points  Count back from 20 2 points 0 points  Months in reverse 0 points 0 points  Repeat phrase 4 points 0 points  Total Score 6 0    Immunization History  Administered Date(s) Administered  . Td 05/03/2009    Screening Tests Health Maintenance  Topic Date Due  . Hepatitis C Screening  [birth year]  . HIV Screening  01/20/1976  . COLONOSCOPY  01/20/2011  . MAMMOGRAM  12/12/2018  . INFLUENZA VACCINE  03/27/2019  . TETANUS/TDAP  05/04/2019  . PAP SMEAR-Modifier  09/05/2023 (Originally 01/19/1982)      Plan:    Please schedule your next medicare wellness visit with me in 1 yr.  Ms. Sofia , Thank you for taking time to come for your Medicare  Wellness Visit. I appreciate your ongoing commitment to your health goals. Please review the following plan we discussed and let me know if I can assist you in the future.  Continue doing brain stimulating activities (puzzles, reading, adult coloring books, staying active) to keep memory sharp.   These are the goals we discussed: Goals    . Patient Stated     To try and start eating healthier.       This is a list of the screening recommended for you and due dates:  Health Maintenance  Topic Date Due  .  Hepatitis C: One time screening is recommended by Center for Disease Control  (CDC) for  adults born from [birth year] through 1965.   August 14, 1961  . HIV Screening  01/20/1976  . Colon Cancer Screening  01/20/2011  . Mammogram  12/12/2018  . Flu Shot  03/27/2019  . Tetanus Vaccine  05/04/2019  . Pap Smear  09/05/2023*  *Topic was postponed. The date shown is not the original due date.     I have personally reviewed and noted the following in the patient's chart:   . Medical and social history . Use of alcohol, tobacco or illicit drugs  . Current medications and supplements . Functional ability and status . Nutritional status . Physical activity . Advanced directives . List of other physicians . Hospitalizations, surgeries, and ER visits in previous 12 months . Vitals . Screenings to include cognitive, depression, and falls . Referrals and appointments  In addition, I have reviewed and discussed with patient certain preventive protocols, quality metrics, and best practice recommendations. A written personalized care plan for preventive services as well as general preventive health recommendations were provided to patient.     Normand Sloop, LPN  02/02/6788

## 2019-10-22 DIAGNOSIS — I10 Essential (primary) hypertension: Secondary | ICD-10-CM | POA: Diagnosis not present

## 2019-10-22 DIAGNOSIS — E0590 Thyrotoxicosis, unspecified without thyrotoxic crisis or storm: Secondary | ICD-10-CM | POA: Diagnosis not present

## 2019-10-22 DIAGNOSIS — G809 Cerebral palsy, unspecified: Secondary | ICD-10-CM | POA: Diagnosis not present

## 2019-10-26 ENCOUNTER — Ambulatory Visit (INDEPENDENT_AMBULATORY_CARE_PROVIDER_SITE_OTHER): Payer: Medicare Other | Admitting: *Deleted

## 2019-10-26 DIAGNOSIS — Z0000 Encounter for general adult medical examination without abnormal findings: Secondary | ICD-10-CM | POA: Diagnosis not present

## 2019-10-26 NOTE — Patient Instructions (Addendum)
Please schedule your next medicare wellness visit with me in 1 yr.  Anna Bowen , Thank you for taking time to come for your Medicare Wellness Visit. I appreciate your ongoing commitment to your health goals. Please review the following plan we discussed and let me know if I can assist you in the future.  Continue doing brain stimulating activities (puzzles, reading, adult coloring books, staying active) to keep memory sharp.  These are the goals we discussed: Goals    . Patient Stated     To try and start eating healthier.

## 2019-10-29 ENCOUNTER — Other Ambulatory Visit: Payer: Self-pay | Admitting: Family Medicine

## 2019-10-29 DIAGNOSIS — N390 Urinary tract infection, site not specified: Secondary | ICD-10-CM

## 2019-10-29 DIAGNOSIS — K589 Irritable bowel syndrome without diarrhea: Secondary | ICD-10-CM

## 2019-11-05 DIAGNOSIS — I10 Essential (primary) hypertension: Secondary | ICD-10-CM | POA: Diagnosis not present

## 2019-11-05 DIAGNOSIS — G809 Cerebral palsy, unspecified: Secondary | ICD-10-CM | POA: Diagnosis not present

## 2019-11-05 DIAGNOSIS — E0590 Thyrotoxicosis, unspecified without thyrotoxic crisis or storm: Secondary | ICD-10-CM | POA: Diagnosis not present

## 2019-11-19 DIAGNOSIS — E0590 Thyrotoxicosis, unspecified without thyrotoxic crisis or storm: Secondary | ICD-10-CM | POA: Diagnosis not present

## 2019-11-19 DIAGNOSIS — G809 Cerebral palsy, unspecified: Secondary | ICD-10-CM | POA: Diagnosis not present

## 2019-11-19 DIAGNOSIS — I10 Essential (primary) hypertension: Secondary | ICD-10-CM | POA: Diagnosis not present

## 2019-12-17 ENCOUNTER — Other Ambulatory Visit: Payer: Self-pay | Admitting: Family Medicine

## 2019-12-17 DIAGNOSIS — N390 Urinary tract infection, site not specified: Secondary | ICD-10-CM

## 2019-12-17 DIAGNOSIS — N3281 Overactive bladder: Secondary | ICD-10-CM

## 2020-01-14 ENCOUNTER — Other Ambulatory Visit: Payer: Self-pay | Admitting: Family Medicine

## 2020-01-14 DIAGNOSIS — N390 Urinary tract infection, site not specified: Secondary | ICD-10-CM

## 2020-01-17 DIAGNOSIS — G809 Cerebral palsy, unspecified: Secondary | ICD-10-CM | POA: Diagnosis not present

## 2020-01-17 DIAGNOSIS — E0590 Thyrotoxicosis, unspecified without thyrotoxic crisis or storm: Secondary | ICD-10-CM | POA: Diagnosis not present

## 2020-01-17 DIAGNOSIS — I10 Essential (primary) hypertension: Secondary | ICD-10-CM | POA: Diagnosis not present

## 2020-01-28 ENCOUNTER — Other Ambulatory Visit: Payer: Self-pay | Admitting: Family Medicine

## 2020-01-28 DIAGNOSIS — N390 Urinary tract infection, site not specified: Secondary | ICD-10-CM

## 2020-01-28 DIAGNOSIS — E0590 Thyrotoxicosis, unspecified without thyrotoxic crisis or storm: Secondary | ICD-10-CM | POA: Diagnosis not present

## 2020-01-28 DIAGNOSIS — I10 Essential (primary) hypertension: Secondary | ICD-10-CM | POA: Diagnosis not present

## 2020-01-28 DIAGNOSIS — N3281 Overactive bladder: Secondary | ICD-10-CM

## 2020-01-28 DIAGNOSIS — G809 Cerebral palsy, unspecified: Secondary | ICD-10-CM | POA: Diagnosis not present

## 2020-02-11 DIAGNOSIS — I10 Essential (primary) hypertension: Secondary | ICD-10-CM | POA: Diagnosis not present

## 2020-02-11 DIAGNOSIS — G809 Cerebral palsy, unspecified: Secondary | ICD-10-CM | POA: Diagnosis not present

## 2020-02-11 DIAGNOSIS — E0590 Thyrotoxicosis, unspecified without thyrotoxic crisis or storm: Secondary | ICD-10-CM | POA: Diagnosis not present

## 2020-02-18 ENCOUNTER — Other Ambulatory Visit: Payer: Self-pay | Admitting: Family Medicine

## 2020-02-18 DIAGNOSIS — N3281 Overactive bladder: Secondary | ICD-10-CM

## 2020-02-25 ENCOUNTER — Other Ambulatory Visit: Payer: Self-pay | Admitting: Family Medicine

## 2020-02-25 DIAGNOSIS — N390 Urinary tract infection, site not specified: Secondary | ICD-10-CM

## 2020-02-25 DIAGNOSIS — I10 Essential (primary) hypertension: Secondary | ICD-10-CM | POA: Diagnosis not present

## 2020-02-25 DIAGNOSIS — E0590 Thyrotoxicosis, unspecified without thyrotoxic crisis or storm: Secondary | ICD-10-CM | POA: Diagnosis not present

## 2020-02-25 DIAGNOSIS — G809 Cerebral palsy, unspecified: Secondary | ICD-10-CM | POA: Diagnosis not present

## 2020-03-14 DIAGNOSIS — G809 Cerebral palsy, unspecified: Secondary | ICD-10-CM | POA: Diagnosis not present

## 2020-04-07 ENCOUNTER — Other Ambulatory Visit: Payer: Self-pay | Admitting: Family Medicine

## 2020-04-07 DIAGNOSIS — N3281 Overactive bladder: Secondary | ICD-10-CM

## 2020-04-07 DIAGNOSIS — K589 Irritable bowel syndrome without diarrhea: Secondary | ICD-10-CM

## 2020-06-01 ENCOUNTER — Other Ambulatory Visit: Payer: Self-pay | Admitting: Sports Medicine

## 2020-06-02 ENCOUNTER — Encounter: Payer: Self-pay | Admitting: Family Medicine

## 2020-06-02 ENCOUNTER — Telehealth (INDEPENDENT_AMBULATORY_CARE_PROVIDER_SITE_OTHER): Payer: Medicare Other | Admitting: Family Medicine

## 2020-06-02 ENCOUNTER — Telehealth: Payer: Self-pay | Admitting: *Deleted

## 2020-06-02 DIAGNOSIS — N390 Urinary tract infection, site not specified: Secondary | ICD-10-CM | POA: Diagnosis not present

## 2020-06-02 DIAGNOSIS — I10 Essential (primary) hypertension: Secondary | ICD-10-CM | POA: Diagnosis not present

## 2020-06-02 DIAGNOSIS — N3281 Overactive bladder: Secondary | ICD-10-CM | POA: Diagnosis not present

## 2020-06-02 DIAGNOSIS — E559 Vitamin D deficiency, unspecified: Secondary | ICD-10-CM

## 2020-06-02 DIAGNOSIS — E0590 Thyrotoxicosis, unspecified without thyrotoxic crisis or storm: Secondary | ICD-10-CM

## 2020-06-02 MED ORDER — AMBULATORY NON FORMULARY MEDICATION: 0 refills | Status: DC

## 2020-06-02 MED ORDER — NITROFURANTOIN MACROCRYSTAL 100 MG PO CAPS: 100.0000 mg | ORAL_CAPSULE | Freq: Every day | ORAL | 1 refills | Status: DC

## 2020-06-02 NOTE — Assessment & Plan Note (Signed)
Due to recheck vitamin D levels. 

## 2020-06-02 NOTE — Assessment & Plan Note (Signed)
Recently filled out paperwork for her incontinence supplies.

## 2020-06-02 NOTE — Assessment & Plan Note (Signed)
We will go ahead and recheck labs today she has not been on the methimazole for at least over a year.  She is not having any current symptoms.

## 2020-06-02 NOTE — Progress Notes (Signed)
Virtual Visit via Telephone Note  I connected with Anna Bowen on 06/02/20 at  3:00 PM EDT by telephone and verified that I am speaking with the correct person using two identifiers.   I discussed the limitations, risks, security and privacy concerns of performing an evaluation and management service by telephone and the availability of in person appointments. I also discussed with the patient that there may be a patient responsible charge related to this service. The patient expressed understanding and agreed to proceed.  Patient location: at home Provider loccation: In office   Subjective:    CC: BP HPI: Is a 59 year old female with cerebral palsy who has not been into the office for follow-up blood pressure since July 2020.  She says she is no longer taking the methimazole as she was not able to follow back up with endocrinology she says she is probably been off of it for about a year.  Hypertension- Pt denies chest pain, SOB, dizziness, or heart palpitations.  Taking meds as directed w/o problems.  Denies medication side effects.    He does have help at home and she does have a nurse coming out every 2 weeks to do her pill box.  He also gets incontinence supplies.  [facility].  Also complains that of occasional pain in her right side.  She says it usually feels like a burning sensation and will last a couple minutes at a time but when it hits it is very painful.  She does not usually treat it she just gives it a few minutes and it goes away.  Is been going on for about a year, ever since she says she hurt her back.  She does have a history of a osteoporotic fracture in her lumbar spine.  History of vitamin D deficiency-   Past medical history, Surgical history, Family history not pertinant except as noted below, Social history, Allergies, and medications have been entered into the medical record, reviewed, and corrections made.   Review of  Systems: No fevers, chills, night sweats, weight loss, chest pain, or shortness of breath.   Objective:    General: Speaking clearly in complete sentences without any shortness of breath.  Alert and oriented x3.  Normal judgment. No apparent acute distress.    Impression and Recommendations:    Hyperthyroidism We will go ahead and recheck labs today she has not been on the methimazole for at least over a year.  She is not having any current symptoms.  ESSENTIAL HYPERTENSION, BENIGN Ports that her blood pressures have been good but the nurse coming out doing her pillbox does usually check her pressure.  She has been asymptomatic as well.  Continue current regimen but I do need up-to-date blood work to make sure that her renal function potassium look good.  We will see if we can [facility] an [facility] out to [facility] to get them to draw that the next time they go out to see her.  OAB (overactive bladder) Recently filled out paperwork for her incontinence supplies.  Vitamin D deficiency Due to recheck vitamin D levels.     I discussed the assessment and treatment plan with the patient. The patient was provided an opportunity to ask questions and all were answered. The patient agreed with the plan and demonstrated an understanding of the instructions.   The patient was advised to call back or seek an in-person evaluation if the symptoms worsen or if the condition fails  to improve as anticipated.  I provided 25 minutes of non-face-to-face time during this encounter.   Nani Gasser, MD

## 2020-06-02 NOTE — Telephone Encounter (Signed)
Sent fax orders for pt to have labs drawn. Confirmation received.

## 2020-06-02 NOTE — Assessment & Plan Note (Signed)
Ports that her blood pressures have been good but the nurse coming out doing her pillbox does usually check her pressure.  She has been asymptomatic as well.  Continue current regimen but I do need up-to-date blood work to make sure that her renal function potassium look good.  We will see if we can send an [facility] to get them to draw that the next time they go out to see her.

## 2020-07-14 ENCOUNTER — Other Ambulatory Visit: Payer: Self-pay | Admitting: Family Medicine

## 2020-08-11 ENCOUNTER — Other Ambulatory Visit: Payer: Self-pay | Admitting: Family Medicine

## 2020-10-12 ENCOUNTER — Other Ambulatory Visit: Payer: Self-pay | Admitting: Family Medicine

## 2020-10-12 DIAGNOSIS — K589 Irritable bowel syndrome without diarrhea: Secondary | ICD-10-CM

## 2020-10-12 DIAGNOSIS — N3281 Overactive bladder: Secondary | ICD-10-CM

## 2020-11-09 ENCOUNTER — Other Ambulatory Visit: Payer: Self-pay | Admitting: Family Medicine

## 2020-11-09 ENCOUNTER — Ambulatory Visit (INDEPENDENT_AMBULATORY_CARE_PROVIDER_SITE_OTHER): Payer: Medicare Other | Admitting: Family Medicine

## 2020-11-09 ENCOUNTER — Encounter: Payer: Self-pay | Admitting: Family Medicine

## 2020-11-09 ENCOUNTER — Other Ambulatory Visit: Payer: Self-pay

## 2020-11-09 VITALS — BP 105/62 | HR 62

## 2020-11-09 DIAGNOSIS — K2100 Gastro-esophageal reflux disease with esophagitis, without bleeding: Secondary | ICD-10-CM

## 2020-11-09 DIAGNOSIS — I10 Essential (primary) hypertension: Secondary | ICD-10-CM | POA: Diagnosis not present

## 2020-11-09 DIAGNOSIS — G809 Cerebral palsy, unspecified: Secondary | ICD-10-CM

## 2020-11-09 DIAGNOSIS — R262 Difficulty in walking, not elsewhere classified: Secondary | ICD-10-CM | POA: Diagnosis not present

## 2020-11-09 DIAGNOSIS — Z114 Encounter for screening for human immunodeficiency virus [HIV]: Secondary | ICD-10-CM

## 2020-11-09 DIAGNOSIS — N3281 Overactive bladder: Secondary | ICD-10-CM | POA: Diagnosis not present

## 2020-11-09 DIAGNOSIS — E559 Vitamin D deficiency, unspecified: Secondary | ICD-10-CM | POA: Diagnosis not present

## 2020-11-09 DIAGNOSIS — K219 Gastro-esophageal reflux disease without esophagitis: Secondary | ICD-10-CM | POA: Insufficient documentation

## 2020-11-09 DIAGNOSIS — K582 Mixed irritable bowel syndrome: Secondary | ICD-10-CM | POA: Diagnosis not present

## 2020-11-09 DIAGNOSIS — Z1159 Encounter for screening for other viral diseases: Secondary | ICD-10-CM | POA: Diagnosis not present

## 2020-11-09 MED ORDER — AMBULATORY NON FORMULARY MEDICATION: 0 refills | Status: AC

## 2020-11-09 MED ORDER — LISINOPRIL 20 MG PO TABS: 20.0000 mg | ORAL_TABLET | Freq: Every day | ORAL | 1 refills | Status: DC

## 2020-11-09 MED ORDER — LANSOPRAZOLE 30 MG PO TBDD: 30.0000 mg | DELAYED_RELEASE_TABLET | ORAL | 1 refills | Status: DC

## 2020-11-09 MED ORDER — AMBULATORY NON FORMULARY MEDICATION: 0 refills | Status: DC

## 2020-11-09 NOTE — Assessment & Plan Note (Signed)
Needs an Mining engineer wheelchair.

## 2020-11-09 NOTE — Assessment & Plan Note (Signed)
Can continue oxybutynin.

## 2020-11-09 NOTE — Assessment & Plan Note (Addendum)
BP is low today.Discussed having home health nurse check BP next couple of times when comes out. If low then please let me know and will change to lisinopril 10mg .  Overdue for labs.  Reminded her of the importance of check labs at least yearly.

## 2020-11-09 NOTE — Assessment & Plan Note (Addendum)
Feels the antispasmodic works well. Will refill medications.

## 2020-11-09 NOTE — Assessment & Plan Note (Signed)
Discussed continuing PPI. She says if she doesn't take it for awhile she gets burning in her esophagus with eating.

## 2020-11-09 NOTE — Progress Notes (Signed)
Established Patient Office Visit  Subjective:  Patient ID: Anna Bowen, female    DOB: 1961/05/18  Age: 60 y.o. MRN: [id number]  CC:  Chief Complaint  Patient presents with  . Hypertension    HPI Aysel Gilchrest presents for   F/U of HTN.   Hypertension- Pt denies chest pain, SOB, dizziness, or heart palpitations.  Taking meds as directed w/o problems.  Denies medication side effects.    CP - she is stable but would like a new wheelchair. She is using her mom's old wheelchair. She says it is the most comfortable.  [facility]  Marchelle Folks comes out   OAB - says the oxybutynin is really helpful. Would like to continue it.    Brought in form Nurse visit from home.    She would also like to discuss nail care.   She thinks a podiatrist would be helpful but says she can't get here. She says even getting here once a year is a major struggle.    Past Medical History:  Diagnosis Date  . Cerebral palsy (HCC)    since birth  . Thyroid disease     No past surgical history on file.  Family History  Problem Relation Age of Onset  . Cancer Father        brain tumor died age 48  . Diabetes Brother   . Osteoporosis Mother   . Lung disease Mother        Restrictive\  . Hypertension Mother     Social History   Socioeconomic History  . Marital status: Single    Spouse name: Not on file  . Number of children: 0  . Years of education: 52  . Highest education level: 12th grade  Occupational History  . Occupation: disability    Comment: [employer]  Tobacco Use  . Smoking status: Never Smoker  . Smokeless tobacco: Never Used  Vaping Use  . Vaping Use: Never used  Substance and Sexual Activity  . Alcohol use: No  . Drug use: No  . Sexual activity: Never    Comment: diabled, no regular exercise.   Other Topics Concern  . Not on file  Social History Narrative   Does word searches at home to help stimulate the brain. Caffeine 1 drink daily    Social Determinants of Health   Financial Resource Strain: Not on file  Food Insecurity: Not on file  Transportation Needs: Not on file  Physical Activity: Not on file  Stress: Not on file  Social Connections: Not on file  Intimate Partner Violence: Not on file    Outpatient Medications Prior to Visit  Medication Sig Dispense Refill  . cholecalciferol (VITAMIN D3) 25 MCG (1000 UNIT) tablet Take 1,000 Units by mouth daily.    Marland Kitchen dicyclomine (BENTYL) 20 MG tablet TAKE 1 TABLET (20 MG TOTAL) BY MOUTH 2 (TWO) TIMES DAILY AS NEEDED. 180 tablet 1  . oxybutynin (DITROPAN) 5 MG tablet TAKE 1 TABLET BY MOUTH THREE TIMES A DAY 270 tablet 1  . lansoprazole (PREVACID SOLUTAB) 30 MG disintegrating tablet Take 1 tablet (30 mg total) by mouth every morning. 90 tablet 1  . lisinopril (ZESTRIL) 20 MG tablet TAKE 1 TABLET BY MOUTH EVERY DAY 90 tablet 0  . AMBULATORY NON FORMULARY MEDICATION Medication Name: CMP, CBC, Lipid, TSH and Vitamin D. DX: HTN, Hyperthyroidism, vitamin D deficiency.  Fax to [facility] 1 Units 0  . nitrofurantoin (MACRODANTIN) 100 MG capsule  Take 1 capsule (100 mg total) by mouth daily. 90 capsule 1   No facility-administered medications prior to visit.    Allergies  Allergen Reactions  . Penicillins Rash    ROS Review of Systems    Objective:    Physical Exam Constitutional:      Appearance: She is well-developed.  HENT:     Head: Normocephalic and atraumatic.  Cardiovascular:     Rate and Rhythm: Normal rate and regular rhythm.     Heart sounds: Normal heart sounds.  Pulmonary:     Effort: Pulmonary effort is normal.     Breath sounds: Normal breath sounds.  Skin:    General: Skin is warm and dry.  Neurological:     Mental Status: She is alert and oriented to person, place, and time.  Psychiatric:        Behavior: Behavior normal.     BP 105/62   Pulse 62   SpO2 100%  Wt Readings from Last 3 Encounters:  11/09/13 95 lb (43.1 kg)     Health  Maintenance Due  Topic Date Due  . Hepatitis C Screening  Never done    There are no preventive care reminders to display for this patient.  Lab Results  Component Value Date   TSH 0.01 (L) 11/11/2017   Lab Results  Component Value Date   WBC 5.3 11/11/2017   HGB 11.4 (L) 11/11/2017   HCT 34.0 (L) 11/11/2017   MCV 90.7 11/11/2017   PLT 225 11/11/2017   Lab Results  Component Value Date   NA 141 11/11/2017   K 3.6 11/11/2017   CO2 21 11/11/2017   GLUCOSE 97 11/11/2017   BUN 57 (H) 11/11/2017   CREATININE 0.82 11/11/2017   BILITOT 0.3 11/11/2017   ALKPHOS 72 05/14/2016   AST 38 (H) 11/11/2017   ALT 34 (H) 11/11/2017   PROT 6.8 11/11/2017   ALBUMIN 4.3 05/14/2016   CALCIUM 9.2 11/18/2017   Lab Results  Component Value Date   CHOL 137 06/18/2017   Lab Results  Component Value Date   HDL 57 06/18/2017   Lab Results  Component Value Date   LDLCALC 64 06/18/2017   Lab Results  Component Value Date   TRIG 82 06/18/2017   Lab Results  Component Value Date   CHOLHDL 2.4 06/18/2017   Lab Results  Component Value Date   HGBA1C 5.1 12/11/2016      Assessment & Plan:   Problem List Items Addressed This Visit      Cardiovascular and Mediastinum   ESSENTIAL HYPERTENSION, BENIGN - Primary    BP is low today.Discussed having home health nurse check BP next couple of times when comes out. If low then please let me know and will change to lisinopril 10mg .  Overdue for labs.  Reminded her of the importance of check labs at least yearly.        Relevant Medications   lisinopril (ZESTRIL) 20 MG tablet   Other Relevant Orders   COMPLETE METABOLIC PANEL WITH GFR   Lipid panel   CBC   TSH   VITAMIN D 25 Hydroxy (Vit-D Deficiency, Fractures)     Digestive   IBS (irritable bowel syndrome)    Feels the antispasmodic works well. Will refill medications.        Relevant Medications   lansoprazole (PREVACID SOLUTAB) 30 MG disintegrating tablet   Other Relevant  Orders   COMPLETE METABOLIC PANEL WITH GFR   Lipid panel  CBC   TSH   VITAMIN D 25 Hydroxy (Vit-D Deficiency, Fractures)   GERD (gastroesophageal reflux disease)    Discussed continuing PPI. She says if she doesn't take it for awhile she gets burning in her esophagus with eating.        Relevant Medications   lansoprazole (PREVACID SOLUTAB) 30 MG disintegrating tablet     Nervous and Auditory   Cerebral palsy (HCC)    Needs an electric wheelchair.        Relevant Orders   COMPLETE METABOLIC PANEL WITH GFR   Lipid panel   CBC   TSH   VITAMIN D 25 Hydroxy (Vit-D Deficiency, Fractures)     Genitourinary   OAB (overactive bladder)    Can continue oxybutynin.          Other   Inability to walk    Will place new order for electric wheelchair.  Will contact [facility] and see if they can provide this. If not can try the scooter store.         Other Visit Diagnoses    Encounter for hepatitis C screening test for low risk patient       Relevant Orders   Hepatitis C Antibody   Encounter for screening for HIV       Relevant Orders   HIV Antibody (routine testing w rflx)      Meds ordered this encounter  Medications  . lisinopril (ZESTRIL) 20 MG tablet    Sig: Take 1 tablet (20 mg total) by mouth daily.    Dispense:  90 tablet    Refill:  1  . lansoprazole (PREVACID SOLUTAB) 30 MG disintegrating tablet    Sig: Take 1 tablet (30 mg total) by mouth every morning.    Dispense:  90 tablet    Refill:  1  . AMBULATORY NON FORMULARY MEDICATION    Sig: Medication Name: Electric wheelchair with controls on the left, seatbelt, gel cushion, etc    Dispense:  1 Units    Refill:  0    Follow-up: Return in about 1 year (around 11/09/2021).    Nani Gasser, MD

## 2020-11-09 NOTE — Assessment & Plan Note (Signed)
Will place new order for electric wheelchair.  Will contact [facility] and see if they can provide this. If not can try the scooter store.

## 2020-11-09 NOTE — Progress Notes (Signed)
Pt asked that once Dr. Linford Arnold hears about getting her an electric wheelchair to call:Connie Allred [phone]

## 2020-11-11 LAB — HIV ANTIBODY (ROUTINE TESTING W REFLEX): HIV 1&2 Ab, 4th Generation: NONREACTIVE

## 2020-11-11 LAB — CBC
HCT: 36.4 % (ref 35.0–45.0)
Hemoglobin: 12.2 g/dL (ref 11.7–15.5)
MCH: 33.1 pg — ABNORMAL HIGH (ref 27.0–33.0)
MCHC: 33.5 g/dL (ref 32.0–36.0)
MCV: 98.6 fL (ref 80.0–100.0)
MPV: 9.8 fL (ref 7.5–12.5)
Platelets: 200 10*3/uL (ref 140–400)
RBC: 3.69 10*6/uL — ABNORMAL LOW (ref 3.80–5.10)
RDW: 12.4 % (ref 11.0–15.0)
WBC: 5.3 10*3/uL (ref 3.8–10.8)

## 2020-11-11 LAB — LIPID PANEL
Cholesterol: 149 mg/dL (ref <200)
HDL: 51 mg/dL (ref >=50)
LDL Cholesterol (Calc): 85 mg/dL (calc)
Non-HDL Cholesterol (Calc): 98 mg/dL (calc) (ref <130)
Total CHOL/HDL Ratio: 2.9 (calc) (ref <5.0)
Triglycerides: 51 mg/dL (ref <150)

## 2020-11-11 LAB — T4, FREE: Free T4: 1.2 ng/dL (ref 0.8–1.8)

## 2020-11-11 LAB — COMPLETE METABOLIC PANEL WITH GFR
AG Ratio: 1.4 (calc) (ref 1.0–2.5)
ALT: 9 U/L (ref 6–29)
AST: 14 U/L (ref 10–35)
Albumin: 3.9 g/dL (ref 3.6–5.1)
Alkaline phosphatase (APISO): 99 U/L (ref 37–153)
BUN/Creatinine Ratio: 41 (calc) — ABNORMAL HIGH (ref 6–22)
BUN: 34 mg/dL — ABNORMAL HIGH (ref 7–25)
CO2: 27 mmol/L (ref 20–32)
Calcium: 9.2 mg/dL (ref 8.6–10.4)
Chloride: 107 mmol/L (ref 98–110)
Creat: 0.83 mg/dL (ref 0.50–1.05)
GFR, Est African American: 89 mL/min/{1.73_m2} (ref >=60)
GFR, Est Non African American: 77 mL/min/{1.73_m2} (ref >=60)
Globulin: 2.7 g/dL (calc) (ref 1.9–3.7)
Glucose, Bld: 81 mg/dL (ref 65–99)
Potassium: 4.4 mmol/L (ref 3.5–5.3)
Sodium: 141 mmol/L (ref 135–146)
Total Bilirubin: 0.3 mg/dL (ref 0.2–1.2)
Total Protein: 6.6 g/dL (ref 6.1–8.1)

## 2020-11-11 LAB — VITAMIN D 25 HYDROXY (VIT D DEFICIENCY, FRACTURES): Vit D, 25-Hydroxy: 110 ng/mL — ABNORMAL HIGH (ref 30–100)

## 2020-11-11 LAB — HEPATITIS C ANTIBODY
Hepatitis C Ab: NONREACTIVE
SIGNAL TO CUT-OFF: 0.01 (ref <1.00)

## 2020-11-11 LAB — TSH: TSH: 0.36 mIU/L — ABNORMAL LOW (ref 0.40–4.50)

## 2020-11-18 ENCOUNTER — Other Ambulatory Visit: Payer: Self-pay | Admitting: Family Medicine

## 2020-11-18 DIAGNOSIS — N390 Urinary tract infection, site not specified: Secondary | ICD-10-CM

## 2020-11-27 ENCOUNTER — Other Ambulatory Visit: Payer: Self-pay | Admitting: *Deleted

## 2020-11-27 DIAGNOSIS — G809 Cerebral palsy, unspecified: Secondary | ICD-10-CM

## 2020-11-27 DIAGNOSIS — R262 Difficulty in walking, not elsewhere classified: Secondary | ICD-10-CM

## 2021-01-12 DIAGNOSIS — G809 Cerebral palsy, unspecified: Secondary | ICD-10-CM | POA: Diagnosis not present

## 2021-01-12 DIAGNOSIS — R262 Difficulty in walking, not elsewhere classified: Secondary | ICD-10-CM | POA: Diagnosis not present

## 2021-02-22 ENCOUNTER — Telehealth (INDEPENDENT_AMBULATORY_CARE_PROVIDER_SITE_OTHER): Payer: Medicare Other | Admitting: Family Medicine

## 2021-02-22 VITALS — Wt 95.0 lb

## 2021-02-22 DIAGNOSIS — Z7689 Persons encountering health services in other specified circumstances: Secondary | ICD-10-CM | POA: Diagnosis not present

## 2021-02-22 DIAGNOSIS — G809 Cerebral palsy, unspecified: Secondary | ICD-10-CM

## 2021-02-22 DIAGNOSIS — S32020A Wedge compression fracture of second lumbar vertebra, initial encounter for closed fracture: Secondary | ICD-10-CM | POA: Diagnosis not present

## 2021-02-22 DIAGNOSIS — R262 Difficulty in walking, not elsewhere classified: Secondary | ICD-10-CM

## 2021-02-22 NOTE — Progress Notes (Signed)
Virtual Visit via Telephone Note  I connected with Anna Bowen on 02/27/21 at  3:00 PM EDT by telephone and verified that I am speaking with the correct person using two identifiers.   I discussed the limitations, risks, security and privacy concerns of performing an evaluation and management service by telephone and the availability of in person appointments. I also discussed with the patient that there may be a patient responsible charge related to this service. The patient expressed understanding and agreed to proceed.  Patient location: at home Provider loccation: at home.    Subjective:    CC: Mobility assessment  HPI: 60 yo female with a history of cerebral palsy who is wheelchair bound and unable to walk.  She has been using a manual wheelchair for years but has significant difficulty and actually moving the wheelchair on her own.  Wants to make sure it is smaller to get in her bathroom. Has a crutch to transition from wheelchair to the toilet. Only get 2.5 hours of assistance a day trough [facility].  She still lives with her brother and he works Community education officer.  She does have truncal instability and in fact has to seatbelt herself into the chair so that she does not eventually slide out of the chair.   Feet  have been swelling some but she is not having pain with it. Not sure if her BP is OK.     Today's Vitals   02/27/21 1256  Weight: 95 lb (43.1 kg)   There is no height or weight on file to calculate BMI.   Past medical history, Surgical history, Family history not pertinant except as noted below, Social history, Allergies, and medications have been entered into the medical record, reviewed, and corrections made.   Review of Systems: No fevers, chills, night sweats, weight loss, chest pain, or shortness of breath.   Objective:    General: Speaking clearly in complete sentences without any shortness of breath.  Alert and oriented x3.  Normal judgment. No apparent acute  distress.    Impression and Recommendations:   Cerebral palsy (HCC) Severely affecting motor coordination.  She would benefit greatly from a power wheelchair.   No truncal stability.  She is willing to use the chair. I do feel that she would benefit from a power wheel chair.   Compression fracture of L2 lumbar vertebra (HCC) Back does ache when the weather changes.  Encounter for power mobility device assessment Patient would benefit significantly from a power wheelchair.  She has had a manual wheelchair for years and is having more and more difficulty maneuvering the wheelchair in and out of the kitchen and around the home.  This would allow her to be able to get into the kitchen to prepare her own meals and assist with toileting.  And light housework.  I have read the PT evaluation and concur with the PT evaluation and recommendations.  Patient does have the mental capacity to operate a power wheelchair safely and the willingness to use it within her home.  I think this would actually really allow her much more freedom with in the home than she has had previously.        I discussed the assessment and treatment plan with the patient. The patient was provided an opportunity to ask questions and all were answered. The patient agreed with the plan and demonstrated an understanding of the instructions.   The patient was advised to call back or seek an in-person evaluation if  the symptoms worsen or if the condition fails to improve as anticipated.  I provided 32 minutes of non-face-to-face time during this encounter.   Nani Gasser, MD

## 2021-02-22 NOTE — Assessment & Plan Note (Signed)
Back does ache when the weather changes.

## 2021-02-22 NOTE — Assessment & Plan Note (Addendum)
Severely affecting motor coordination.  She would benefit greatly from a power wheelchair.   No truncal stability.  She is willing to use the chair. I do feel that she would benefit from a power wheel chair.

## 2021-02-27 ENCOUNTER — Encounter: Payer: Self-pay | Admitting: Family Medicine

## 2021-02-27 DIAGNOSIS — Z0000 Encounter for general adult medical examination without abnormal findings: Secondary | ICD-10-CM | POA: Insufficient documentation

## 2021-02-27 DIAGNOSIS — Z7689 Persons encountering health services in other specified circumstances: Secondary | ICD-10-CM | POA: Insufficient documentation

## 2021-02-27 NOTE — Assessment & Plan Note (Signed)
Patient would benefit significantly from a power wheelchair.  She has had a manual wheelchair for years and is having more and more difficulty maneuvering the wheelchair in and out of the kitchen and around the home.  This would allow her to be able to get into the kitchen to prepare her own meals and assist with toileting.  And light housework.  I have read the PT evaluation and concur with the PT evaluation and recommendations.  Patient does have the mental capacity to operate a power wheelchair safely and the willingness to use it within her home.  I think this would actually really allow her much more freedom with in the home than she has had previously.

## 2021-03-23 ENCOUNTER — Emergency Department (INDEPENDENT_AMBULATORY_CARE_PROVIDER_SITE_OTHER)
Admission: EM | Admit: 2021-03-23 | Discharge: 2021-03-23 | Disposition: A | Discharge status: Home / Self Care | Payer: Medicare Other | Source: Home / Self Care

## 2021-03-23 DIAGNOSIS — S99922A Unspecified injury of left foot, initial encounter: Secondary | ICD-10-CM | POA: Diagnosis not present

## 2021-03-23 MED ORDER — SULFAMETHOXAZOLE-TRIMETHOPRIM 400-80 MG PO TABS: 1.0000 | ORAL_TABLET | Freq: Two times a day (BID) | ORAL | 0 refills | Status: AC

## 2021-03-23 NOTE — ED Triage Notes (Signed)
Pt presents to Urgent Care d/t toenail of 3rd toe to L foot being partially detached today after her CNA was taking her sock off. Toenail noted to be thick, long, and nearly detached. Bleeding controlled.
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
## 2021-03-23 NOTE — Discharge Instructions (Addendum)
Advised patient to take medication as directed with food to completion.  Advised patient to keep left third toe covered for the next 2-3 days and then open to air so that nailbed can heal by secondary intention.

## 2021-04-23 ENCOUNTER — Other Ambulatory Visit: Payer: Self-pay | Admitting: Family Medicine

## 2021-04-23 DIAGNOSIS — N3281 Overactive bladder: Secondary | ICD-10-CM

## 2021-04-23 DIAGNOSIS — K589 Irritable bowel syndrome without diarrhea: Secondary | ICD-10-CM

## 2021-05-24 ENCOUNTER — Other Ambulatory Visit: Payer: Self-pay | Admitting: Family Medicine

## 2021-05-24 DIAGNOSIS — N390 Urinary tract infection, site not specified: Secondary | ICD-10-CM

## 2021-07-02 ENCOUNTER — Ambulatory Visit (INDEPENDENT_AMBULATORY_CARE_PROVIDER_SITE_OTHER): Payer: Medicare Other | Admitting: Family Medicine

## 2021-07-02 DIAGNOSIS — Z0000 Encounter for general adult medical examination without abnormal findings: Secondary | ICD-10-CM

## 2021-07-02 NOTE — Progress Notes (Signed)
MEDICARE ANNUAL WELLNESS VISIT  07/02/2021  Telephone Visit Disclaimer This Medicare AWV was conducted by telephone due to national recommendations for restrictions regarding the COVID-19 Pandemic (e.g. social distancing).  I verified, using two identifiers, that I am speaking with Anna Bowen or their authorized healthcare agent. I discussed the limitations, risks, security, and privacy concerns of performing an evaluation and management service by telephone and the potential availability of an in-person appointment in the future. The patient expressed understanding and agreed to proceed.  Location of Patient: Home Location of Provider (nurse):  In the office.  Subjective:    Anna Bowen is a 60 y.o. female patient of Metheney, Barbarann Ehlers, MD who had a Medicare Annual Wellness Visit today via telephone. Vivi is Legally disabled and lives with their brother. she has 0 children. she reports that she is socially active and does interact with friends/family regularly. she is not physically active and enjoys knitting.  Patient Care Team: Agapito Games, MD as PCP - General  Advanced Directives 10/26/2019 06/29/2018  Does Patient Have a Medical Advance Directive? No No  Would patient like information on creating a medical advance directive? No - Patient declined Yes (MAU/Ambulatory/Procedural Areas - Information given)    Hospital Utilization Over the Past 12 Months: # of hospitalizations or ER visits: 0 # of surgeries: 0  Review of Systems    Patient reports that her overall health is unchanged compared to last year.  History obtained from chart review and the patient  Patient Reported Readings (BP, Pulse, CBG, Weight, etc) none  Pain Assessment Pain : No/denies pain     Current Medications & Allergies (verified) Allergies as of 07/02/2021       Reactions   Penicillins Rash        Medication List        Accurate as of July 02, 2021 11:25 AM. If  you have any questions, ask your nurse or doctor.          AMBULATORY NON FORMULARY MEDICATION Medication Name: Electric wheelchair with controls on the left, seatbelt, gel cushion, etc   cholecalciferol 25 MCG (1000 UNIT) tablet Commonly known as: VITAMIN D3 Take 1,000 Units by mouth daily.   dicyclomine 20 MG tablet Commonly known as: BENTYL TAKE 1 TABLET BY MOUTH 2 TIMES DAILY AS NEEDED.   lansoprazole 30 MG disintegrating tablet Commonly known as: PREVACID SOLUTAB TAKE 1 TABLET BY MOUTH EVERY MORNING   lisinopril 20 MG tablet Commonly known as: ZESTRIL TAKE 1 TABLET BY MOUTH EVERY DAY   nitrofurantoin 100 MG capsule Commonly known as: MACRODANTIN TAKE 1 CAPSULE BY MOUTH EVERY DAY   oxybutynin 5 MG tablet Commonly known as: DITROPAN TAKE 1 TABLET BY MOUTH THREE TIMES A DAY        History (reviewed): Past Medical History:  Diagnosis Date   Cerebral palsy (HCC)    since birth   Thyroid disease    History reviewed. No pertinent surgical history. Family History  Problem Relation Age of Onset   Osteoporosis Mother    Lung disease Mother        Restrictive\   Hypertension Mother    Cancer Father        brain tumor died age 40   Diabetes Brother    Social History   Socioeconomic History   Marital status: Single    Spouse name: Not on file   Number of children: 0   Years of education: 12   Highest  education level: 12th grade  Occupational History   Occupation: disability    Comment: [employer]  Tobacco Use   Smoking status: Never   Smokeless tobacco: Never  Vaping Use   Vaping Use: Never used  Substance and Sexual Activity   Alcohol use: No   Drug use: No   Sexual activity: Not on file  Other Topics Concern   Not on file  Social History Narrative   Lives with her brother. She enjoys knitting and Does word searches at home to help stimulate the brain.    Social Determinants of Health   Financial Resource Strain: Low Risk    Difficulty of  Paying Living Expenses: Not hard at all  Food Insecurity: No Food Insecurity   Worried About Programme researcher, broadcasting/film/video in the Last Year: Never true   Ran Out of Food in the Last Year: Never true  Transportation Needs: No Transportation Needs   Lack of Transportation (Medical): No   Lack of Transportation (Non-Medical): No  Physical Activity: Inactive   Days of Exercise per Week: 0 days   Minutes of Exercise per Session: 0 min  Stress: No Stress Concern Present   Feeling of Stress : Only a little  Social Connections: Socially Isolated   Frequency of Communication with Friends and Family: Never   Frequency of Social Gatherings with Friends and Family: More than three times a week   Attends Religious Services: Never   Database administrator or Organizations: No   Attends Banker Meetings: Never   Marital Status: Never married    Activities of Daily Living In your present state of health, do you have any difficulty performing the following activities: 07/02/2021  Hearing? N  Vision? N  Difficulty concentrating or making decisions? N  Walking or climbing stairs? Y  Comment She doesn't walk any more due to her CP.  Dressing or bathing? Y  Comment She needs assistance with that.  Doing errands, shopping? Y  Comment She doesn't drive due to her CP.  Preparing Food and eating ? Y  Comment Her aide helps her with that.  Using the Toilet? Y  Comment She needs assistance and her brother / or her aide helps with that. She uses depends as well.  In the past six months, have you accidently leaked urine? Y  Comment She wears depends.  Do you have problems with loss of bowel control? Y  Comment She usually wears depends.  Managing your Medications? Y  Comment Her aide helps her with the pill box.  Managing your Finances? Y  Housekeeping or managing your Housekeeping? Y  Some recent data might be hidden    Patient Education/ Literacy How often do you need to have someone help you  when you read instructions, pamphlets, or other written materials from your doctor or pharmacy?: 1 - Never What is the last grade level you completed in school?: High school  Exercise Current Exercise Habits: The patient does not participate in regular exercise at present, Exercise limited by: neurologic condition(s);orthopedic condition(s)  Diet Patient reports consuming  1-2  meals a day and 0 snack(s) a day Patient reports that her primary diet is: Regular Patient reports that she does have regular access to food.   Depression Screen PHQ 2/9 Scores 07/02/2021 06/02/2020 10/26/2019 03/04/2019 06/29/2018 03/18/2018 12/15/2017  PHQ - 2 Score 0 1 0 1 0 2 4  PHQ- 9 Score - - - - - 3 14     Fall  Risk Fall Risk  07/02/2021 11/09/2020 06/02/2020 10/26/2019 03/04/2019  Falls in the past year? 0 Exclusion - non ambulatory Exclusion - non ambulatory 0 Exclusion - non ambulatory  Number falls in past yr: 0 - - - -  Injury with Fall? 0 - - - -  Risk for fall due to : Impaired mobility No Fall Risks - Impaired mobility;Impaired balance/gait;History of fall(s) -  Follow up Falls evaluation completed;Education provided;Falls prevention discussed - - Falls prevention discussed;Education provided -     Objective:  Avilene Marrin seemed alert and oriented and she participated appropriately during our telephone visit.  Blood Pressure Weight BMI  BP Readings from Last 3 Encounters:  03/23/21 113/73  11/09/20 105/62  06/29/18 (!) 105/59   Wt Readings from Last 3 Encounters:  03/23/21 95 lb (43.1 kg)  02/27/21 95 lb (43.1 kg)  11/09/13 95 lb (43.1 kg)   BMI Readings from Last 1 Encounters:  11/09/13 17.95 kg/m    *Unable to obtain current vital signs, weight, and BMI due to telephone visit type  Hearing/Vision  Flecia did not seem to have difficulty with hearing/understanding during the telephone conversation Reports that she has not had a formal eye exam by an eye care professional within the past  year Reports that she has not had a formal hearing evaluation within the past year *Unable to fully assess hearing and vision during telephone visit type  Cognitive Function: 6CIT Screen 07/02/2021 10/26/2019 06/29/2018  What Year? 0 points 0 points 0 points  What month? 0 points 0 points 0 points  What time? 0 points 0 points 0 points  Count back from 20 0 points 2 points 0 points  Months in reverse 0 points 0 points 0 points  Repeat phrase 0 points 4 points 0 points  Total Score 0 6 0   (Normal:0-7, Significant for Dysfunction: >8)  Normal Cognitive Function Screening: Yes   Immunization & Health Maintenance Record Immunization History  Administered Date(s) Administered   Td 05/03/2009    Health Maintenance  Topic Date Due   Zoster Vaccines- Shingrix (1 of 2) 10/02/2021 (Originally 01/20/2011)   MAMMOGRAM  11/09/2021 (Originally 12/12/2018)   INFLUENZA VACCINE  11/23/2021 (Originally 03/26/2021)   COLONOSCOPY (Pts 45-20yrs Insurance coverage will need to be confirmed)  07/02/2022 (Originally 01/19/2006)   TETANUS/TDAP  07/02/2022 (Originally 05/04/2019)   PAP SMEAR-Modifier  09/05/2023 (Originally 01/19/1982)   Hepatitis C Screening  Completed   HIV Screening  Completed   Pneumococcal Vaccine 40-73 Years old  Aged Out   HPV VACCINES  Aged Out       Assessment  This is a routine wellness examination for [employer].  Health Maintenance: Due or Overdue There are no preventive care reminders to display for this patient.   Anna Bowen does not need a referral for Community Assistance: Care Management:   no Social Work:    no Prescription Assistance:  no Nutrition/Diabetes Education:  no   Plan:  Personalized Goals  Goals Addressed               This Visit's Progress     Patient Stated (pt-stated)        07/02/2021 AWV Goal: Improved Nutrition/Diet  Patient will verbalize understanding that diet plays an important role in overall health and that a poor  diet is a risk factor for many chronic medical conditions.  Over the next year, patient will improve self management of their diet by incorporating improved meal pattern. Patient  will utilize available community resources to help with food acquisition if needed (ex: food pantries, [address], etc) Patient will work with nutrition specialist if a referral was made        Personalized Health Maintenance & Screening Recommendations  Influenza vaccine Td vaccine Screening mammography Screening Pap smear and pelvic exam  Colorectal cancer screening Shingrix vaccine  Patient declined the referral for mammogram and colonoscopy at this time.   She also declined the flu vaccine, tetanus shot and shingles vaccine at this time.  Lung Cancer Screening Recommended: no (Low Dose CT Chest recommended if Age 16-80 years, 30 pack-year currently smoking OR have quit w/in past 15 years) Hepatitis C Screening recommended: no HIV Screening recommended: no  Advanced Directives: Written information was not prepared per patient's request.  Referrals & Orders No orders of the defined types were placed in this encounter.   Follow-up Plan Follow-up with Agapito Games, MD as planned She will call back to make the appointment for her Pap smear. Medicare wellness visit in one year.  AVS printed and mailed to the patient.   I have personally reviewed and noted the following in the patient's chart:   Medical and social history Use of alcohol, tobacco or illicit drugs  Current medications and supplements Functional ability and status Nutritional status Physical activity Advanced directives List of other physicians Hospitalizations, surgeries, and ER visits in previous 12 months Vitals Screenings to include cognitive, depression, and falls Referrals and appointments  In addition, I have reviewed and discussed with Anna Bowen certain preventive protocols, quality metrics, and best  practice recommendations. A written personalized care plan for preventive services as well as general preventive health recommendations is available and can be mailed to the patient at her request.      Modesto Charon, RN  07/02/2021

## 2021-07-02 NOTE — Patient Instructions (Addendum)
MEDICARE ANNUAL WELLNESS VISIT Health Maintenance Summary and Written Plan of Care  Anna Bowen ,  Thank you for allowing me to perform your Medicare Annual Wellness Visit and for your ongoing commitment to your health.   Health Maintenance & Immunization History Health Maintenance  Topic Date Due   Zoster Vaccines- Shingrix (1 of 2) 10/02/2021 (Originally 01/20/2011)   MAMMOGRAM  11/09/2021 (Originally 12/12/2018)   INFLUENZA VACCINE  11/23/2021 (Originally 03/26/2021)   COLONOSCOPY (Pts 45-82yrs Insurance coverage will need to be confirmed)  07/02/2022 (Originally 01/19/2006)   TETANUS/TDAP  07/02/2022 (Originally 05/04/2019)   PAP SMEAR-Modifier  09/05/2023 (Originally 01/19/1982)   Hepatitis C Screening  Completed   HIV Screening  Completed   Pneumococcal Vaccine 60-52 Years old  Aged Out   HPV VACCINES  Aged Out   Immunization History  Administered Date(s) Administered   Td 05/03/2009    These are the patient goals that we discussed:  Goals Addressed               This Visit's Progress     Patient Stated (pt-stated)        07/02/2021 AWV Goal: Improved Nutrition/Diet  Patient will verbalize understanding that diet plays an important role in overall health and that a poor diet is a risk factor for many chronic medical conditions.  Over the next year, patient will improve self management of their diet by incorporating improved meal pattern. Patient will utilize available community resources to help with food acquisition if needed (ex: food pantries, [address], etc) Patient will work with nutrition specialist if a referral was made          This is a list of Health Maintenance Items that are overdue or due now: Influenza vaccine Td vaccine Screening mammography Screening Pap smear and pelvic exam  Colorectal cancer screening Shingrix vaccine  Patient declined the referral for mammogram and colonoscopy at this time.   She also declined the flu vaccine, tetanus shot and  shingles vaccine at this time.  Orders/Referrals Placed Today: No orders of the defined types were placed in this encounter.  (Contact our referral department at [phone] if you have not spoken with someone about your referral appointment within the next 5 days)    Follow-up Plan Follow-up with Agapito Games, MD as planned She will call back to make the appointment for her Pap smear. Medicare wellness visit in one year.  AVS printed and mailed to the patient.      Health Maintenance, Female Adopting a healthy lifestyle and getting preventive care are important in promoting health and wellness. Ask your health care provider about: The right schedule for you to have regular tests and exams. Things you can do on your own to prevent diseases and keep yourself healthy. What should I know about diet, weight, and exercise? Eat a healthy diet  Eat a diet that includes plenty of vegetables, fruits, low-fat dairy products, and lean protein. Do not eat a lot of foods that are high in solid fats, added sugars, or sodium. Maintain a healthy weight Body mass index (BMI) is used to identify weight problems. It estimates body fat based on height and weight. Your health care provider can help determine your BMI and help you achieve or maintain a healthy weight. Get regular exercise Get regular exercise. This is one of the most important things you can do for your health. Most adults should: Exercise for at least 150 minutes each week. The exercise should increase your heart  rate and make you sweat (moderate-intensity exercise). Do strengthening exercises at least twice a week. This is in addition to the moderate-intensity exercise. Spend less time sitting. Even light physical activity can be beneficial. Watch cholesterol and blood lipids Have your blood tested for lipids and cholesterol at 60 years of age, then have this test every 5 years. Have your cholesterol levels checked more  often if: Your lipid or cholesterol levels are high. You are older than 60 years of age. You are at high risk for heart disease. What should I know about cancer screening? Depending on your health history and family history, you may need to have cancer screening at various ages. This may include screening for: Breast cancer. Cervical cancer. Colorectal cancer. Skin cancer. Lung cancer. What should I know about heart disease, diabetes, and high blood pressure? Blood pressure and heart disease High blood pressure causes heart disease and increases the risk of stroke. This is more likely to develop in people who have high blood pressure readings or are overweight. Have your blood pressure checked: Every 3-5 years if you are 60-17 years of age. Every year if you are 35 years old or older. Diabetes Have regular diabetes screenings. This checks your fasting blood sugar level. Have the screening done: Once every three years after age 60 if you are at a normal weight and have a low risk for diabetes. More often and at a younger age if you are overweight or have a high risk for diabetes. What should I know about preventing infection? Hepatitis B If you have a higher risk for hepatitis B, you should be screened for this virus. Talk with your health care provider to find out if you are at risk for hepatitis B infection. Hepatitis C Testing is recommended for: Everyone born from [birth year] through 1965. Anyone with known risk factors for hepatitis C. Sexually transmitted infections (STIs) Get screened for STIs, including gonorrhea and chlamydia, if: You are sexually active and are younger than 60 years of age. You are older than 60 years of age and your health care provider tells you that you are at risk for this type of infection. Your sexual activity has changed since you were last screened, and you are at increased risk for chlamydia or gonorrhea. Ask your health care provider if you are at  risk. Ask your health care provider about whether you are at high risk for HIV. Your health care provider may recommend a prescription medicine to help prevent HIV infection. If you choose to take medicine to prevent HIV, you should first get tested for HIV. You should then be tested every 3 months for as long as you are taking the medicine. Pregnancy If you are about to stop having your period (premenopausal) and you may become pregnant, seek counseling before you get pregnant. Take 400 to 800 micrograms (mcg) of folic acid every day if you become pregnant. Ask for birth control (contraception) if you want to prevent pregnancy. Osteoporosis and menopause Osteoporosis is a disease in which the bones lose minerals and strength with aging. This can result in bone fractures. If you are 32 years old or older, or if you are at risk for osteoporosis and fractures, ask your health care provider if you should: Be screened for bone loss. Take a calcium or vitamin D supplement to lower your risk of fractures. Be given hormone replacement therapy (HRT) to treat symptoms of menopause. Follow these instructions at home: Alcohol use Do not drink alcohol if:  Your health care provider tells you not to drink. You are pregnant, may be pregnant, or are planning to become pregnant. If you drink alcohol: Limit how much you have to: 0-1 drink a day. Know how much alcohol is in your drink. In the [place] one drink equals one 12 oz bottle of beer (355 mL), one 5 oz glass of wine (148 mL), or one 1 oz glass of hard liquor (44 mL). Lifestyle Do not use any products that contain nicotine or tobacco. These products include cigarettes, chewing tobacco, and vaping devices, such as e-cigarettes. If you need help quitting, ask your health care provider. Do not use street drugs. Do not share needles. Ask your health care provider for help if you need support or information about quitting drugs. General instructions Schedule  regular health, dental, and eye exams. Stay current with your vaccines. Tell your health care provider if: You often feel depressed. You have ever been abused or do not feel safe at home. Summary Adopting a healthy lifestyle and getting preventive care are important in promoting health and wellness. Follow your health care provider's instructions about healthy diet, exercising, and getting tested or screened for diseases. Follow your health care provider's instructions on monitoring your cholesterol and blood pressure. This information is not intended to replace advice given to you by your health care provider. Make sure you discuss any questions you have with your health care provider. Document Revised: 01/01/2021 Document Reviewed: 01/01/2021 Elsevier Patient Education  2022 [employer].

## 2021-07-26 ENCOUNTER — Other Ambulatory Visit: Payer: Self-pay | Admitting: *Deleted

## 2021-07-26 DIAGNOSIS — R262 Difficulty in walking, not elsewhere classified: Secondary | ICD-10-CM

## 2021-07-26 DIAGNOSIS — G809 Cerebral palsy, unspecified: Secondary | ICD-10-CM

## 2021-07-26 DIAGNOSIS — N3281 Overactive bladder: Secondary | ICD-10-CM

## 2021-07-26 DIAGNOSIS — S32020A Wedge compression fracture of second lumbar vertebra, initial encounter for closed fracture: Secondary | ICD-10-CM

## 2021-07-26 MED ORDER — AMBULATORY NON FORMULARY MEDICATION: 99 refills | Status: DC

## 2021-08-13 ENCOUNTER — Telehealth: Payer: Self-pay | Admitting: Family Medicine

## 2021-08-13 ENCOUNTER — Other Ambulatory Visit: Payer: Self-pay

## 2021-08-13 ENCOUNTER — Other Ambulatory Visit: Payer: Self-pay | Admitting: Family Medicine

## 2021-08-13 DIAGNOSIS — I10 Essential (primary) hypertension: Secondary | ICD-10-CM

## 2021-08-13 DIAGNOSIS — R262 Difficulty in walking, not elsewhere classified: Secondary | ICD-10-CM

## 2021-08-13 DIAGNOSIS — G809 Cerebral palsy, unspecified: Secondary | ICD-10-CM

## 2021-08-13 MED ORDER — LANSOPRAZOLE 30 MG PO TBDD: 30.0000 mg | DELAYED_RELEASE_TABLET | Freq: Every morning | ORAL | 1 refills | Status: DC

## 2021-08-13 MED ORDER — LANSOPRAZOLE 30 MG PO TBDD
30.0000 mg | DELAYED_RELEASE_TABLET | Freq: Every morning | ORAL | 1 refills | Status: DC
Start: 2021-08-13 — End: 2021-10-23

## 2021-08-13 NOTE — Telephone Encounter (Signed)
Received the following Mychart message:  Diane R Mabe  P [facility] Clinical Pool (supporting Agapito Games, MD) 3 days ago   Talked to sitter again about Denise's care.  Needs someone from Dr's office to send a referral to [facility] [phone]) to have Home Nurse set up to come out every 2 weeks to monitor Denise's blood pressure and to fix her medication tray.  Also, she  needs a prescription for Prevacid Solutab 30 mg.  It is listed under her current medications, but she does not have any. Currently taking over the counter meds for GIRDS and it is not helping her at all.  I appreciate your help .  I have become the middle person.  Let me know if you have questions.  Thank you...Marland KitchenMarland KitchenDian Mabe   Refill sent to pharmacy.  Orders for [facility] faxed to [facility].  Tiajuana Amass, CMA

## 2021-08-13 NOTE — Telephone Encounter (Signed)
Meds ordered this encounter  Medications   lansoprazole (PREVACID SOLUTAB) 30 MG disintegrating tablet    Sig: Take 1 tablet (30 mg total) by mouth every morning.    Dispense:  90 tablet    Refill:  1

## 2021-08-21 ENCOUNTER — Telehealth: Payer: Self-pay | Admitting: Family Medicine

## 2021-08-21 NOTE — Telephone Encounter (Signed)
Patient's family member Lupita Leash called and stated that Dr Linford Arnold needs to send Via Fax the diagnosis of patient to Maryruth Hancock. at [facility] pertaining to Respite Care for patient.  [place] Phone: [phone] [facility] Fax: [phone]

## 2021-08-21 NOTE — Telephone Encounter (Signed)
LVM for Anna Bowen at [facility] to return call to see exactly what she needs.  Tiajuana Amass, CMA

## 2021-08-24 NOTE — Telephone Encounter (Signed)
LVM for Anna Bowen at [facility] to return call to see exactly what she needs.  Tiajuana Amass, CMA

## 2021-08-28 NOTE — Telephone Encounter (Signed)
Anna Bowen called and stated that she will need either a Doctor's note or a statement indicating pt's Dx of CP.   Letter written and faxed to [phone]

## 2021-08-28 NOTE — Telephone Encounter (Signed)
LVM for Anna Bowen at [facility] to return call to see exactly what she needs.  Tiajuana Amass, CMA

## 2021-09-22 DIAGNOSIS — M25461 Effusion, right knee: Secondary | ICD-10-CM | POA: Diagnosis not present

## 2021-09-22 DIAGNOSIS — I10 Essential (primary) hypertension: Secondary | ICD-10-CM | POA: Diagnosis not present

## 2021-09-22 DIAGNOSIS — R609 Edema, unspecified: Secondary | ICD-10-CM | POA: Diagnosis not present

## 2021-09-22 DIAGNOSIS — Z79899 Other long term (current) drug therapy: Secondary | ICD-10-CM | POA: Diagnosis not present

## 2021-09-22 DIAGNOSIS — S79911A Unspecified injury of right hip, initial encounter: Secondary | ICD-10-CM | POA: Diagnosis not present

## 2021-09-22 DIAGNOSIS — Z9181 History of falling: Secondary | ICD-10-CM | POA: Diagnosis not present

## 2021-09-22 DIAGNOSIS — Z993 Dependence on wheelchair: Secondary | ICD-10-CM | POA: Diagnosis not present

## 2021-09-22 DIAGNOSIS — Z743 Need for continuous supervision: Secondary | ICD-10-CM | POA: Diagnosis not present

## 2021-09-22 DIAGNOSIS — W19XXXA Unspecified fall, initial encounter: Secondary | ICD-10-CM | POA: Diagnosis not present

## 2021-09-22 DIAGNOSIS — Z880 Allergy status to penicillin: Secondary | ICD-10-CM | POA: Diagnosis not present

## 2021-09-22 DIAGNOSIS — S0093XA Contusion of unspecified part of head, initial encounter: Secondary | ICD-10-CM | POA: Diagnosis not present

## 2021-09-22 DIAGNOSIS — R627 Adult failure to thrive: Secondary | ICD-10-CM | POA: Diagnosis not present

## 2021-09-22 DIAGNOSIS — M25561 Pain in right knee: Secondary | ICD-10-CM | POA: Diagnosis not present

## 2021-09-22 DIAGNOSIS — G809 Cerebral palsy, unspecified: Secondary | ICD-10-CM | POA: Diagnosis not present

## 2021-10-05 ENCOUNTER — Other Ambulatory Visit: Payer: Self-pay

## 2021-10-05 ENCOUNTER — Encounter: Payer: Self-pay | Admitting: Family Medicine

## 2021-10-05 ENCOUNTER — Ambulatory Visit (INDEPENDENT_AMBULATORY_CARE_PROVIDER_SITE_OTHER): Payer: Medicare Other | Admitting: Family Medicine

## 2021-10-05 VITALS — BP 112/49 | HR 69

## 2021-10-05 DIAGNOSIS — G809 Cerebral palsy, unspecified: Secondary | ICD-10-CM | POA: Diagnosis not present

## 2021-10-05 DIAGNOSIS — R21 Rash and other nonspecific skin eruption: Secondary | ICD-10-CM

## 2021-10-05 DIAGNOSIS — R262 Difficulty in walking, not elsewhere classified: Secondary | ICD-10-CM

## 2021-10-05 DIAGNOSIS — I10 Essential (primary) hypertension: Secondary | ICD-10-CM | POA: Diagnosis not present

## 2021-10-05 DIAGNOSIS — T7401XA Adult neglect or abandonment, confirmed, initial encounter: Secondary | ICD-10-CM

## 2021-10-05 MED ORDER — NYSTATIN 100000 UNIT/GM EX CREA: 1.0000 "application " | TOPICAL_CREAM | Freq: Two times a day (BID) | CUTANEOUS | 1 refills | Status: DC
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
## 2021-10-07 ENCOUNTER — Telehealth: Payer: Self-pay | Admitting: Family Medicine

## 2021-10-07 ENCOUNTER — Encounter: Payer: Self-pay | Admitting: Family Medicine

## 2021-10-07 DIAGNOSIS — G809 Cerebral palsy, unspecified: Secondary | ICD-10-CM

## 2021-10-07 NOTE — Telephone Encounter (Signed)
Please call Kaylyn Lim and find out how many hours per week of personal cae services she is getting and how much she qualifies for

## 2021-10-08 NOTE — Assessment & Plan Note (Signed)
Blood pressure is at goal today.

## 2021-10-08 NOTE — Assessment & Plan Note (Signed)
Can I see if we can connect with a Child psychotherapist through home health if not we can contact social services individually.  I do not feel that she is safe to continue to live in the home she really needs to be placed in a 24-hour care facility where she can have hot regular meals and attention to daily hygiene and ADLs

## 2021-10-11 MED ORDER — AMBULATORY NON FORMULARY MEDICATION: 0 refills | Status: AC

## 2021-10-11 NOTE — Telephone Encounter (Signed)
She is not getting anywhere near 18 hours/week.  We will see if we can get her established with a new home health company for personal care services.  Order printed and placed in basket

## 2021-10-11 NOTE — Telephone Encounter (Signed)
Called Frances Furbish at [phone] and spoke with [facility]. Per Herbert Seta patient is receiving 18.5 hours per week/80 hours a month. Herbert Seta stated patient is receiving max amount of hours. Heather stated Dr. Linford Arnold can send a new order to [facility] health for reassessment but patient may not be approved since patient is receiving max hours. Forward to Dr. Linford Arnold

## 2021-10-12 NOTE — Telephone Encounter (Signed)
Order faxed to [facility] at [phone] with confirmation received.

## 2021-10-16 ENCOUNTER — Telehealth: Payer: Self-pay | Admitting: Family Medicine

## 2021-10-16 NOTE — Telephone Encounter (Signed)
Received a call from Thedora Hinders. She stated she is a friend of the patient and use to be her care giver until Dec. Per Lupita Leash patient is not safe in the home and really need assistance. Stated patient has lost weight and barely eating. Lupita Leash asked if Dr. Linford Arnold would give her a call back. Explained to Lupita Leash that since she is not listed on the patient chart as emergency contact or on the DPR Dr. Linford Arnold is not able to release any information. She expressed she understood but something needs to be done because she is not receiving the right care.

## 2021-10-18 ENCOUNTER — Telehealth: Payer: Self-pay | Admitting: Family Medicine

## 2021-10-18 NOTE — Telephone Encounter (Signed)
Several referrals and calls have been placed for patient for Home Health Services. We are currently awaiting response from [facility] care for approval/confirmation for more home health hours for patient. Will discuss with patient's current contact information.
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
## 2021-10-19 ENCOUNTER — Telehealth: Payer: Self-pay | Admitting: Family Medicine

## 2021-10-19 DIAGNOSIS — R262 Difficulty in walking, not elsewhere classified: Secondary | ICD-10-CM

## 2021-10-19 DIAGNOSIS — G809 Cerebral palsy, unspecified: Secondary | ICD-10-CM

## 2021-10-22 NOTE — Patient Outreach (Signed)
[facility]) Care Management  10/22/2021  Hetty Linhart 09/18/1960 [id number]   MD Referral from PCP Dr. Linford Arnold for assistance as patient is home bound and not getting her services needed. Assigned patient to Elliot Cousin, RN Care Coordinator and Reece Levy, LCSW to follow up with patient.  Vanice [facility] Care Management Assistant [phone]

## 2021-10-23 ENCOUNTER — Other Ambulatory Visit: Payer: Self-pay | Admitting: Family Medicine

## 2021-10-23 ENCOUNTER — Telehealth: Payer: Self-pay | Admitting: *Deleted

## 2021-10-23 ENCOUNTER — Telehealth: Payer: Self-pay

## 2021-10-23 DIAGNOSIS — N3281 Overactive bladder: Secondary | ICD-10-CM

## 2021-10-23 DIAGNOSIS — K589 Irritable bowel syndrome without diarrhea: Secondary | ICD-10-CM

## 2021-10-23 DIAGNOSIS — I10 Essential (primary) hypertension: Secondary | ICD-10-CM

## 2021-10-23 MED ORDER — LANSOPRAZOLE 30 MG PO TBDD: 30.0000 mg | DELAYED_RELEASE_TABLET | Freq: Every morning | ORAL | 1 refills | Status: DC

## 2021-10-23 MED ORDER — LISINOPRIL 20 MG PO TABS: 20.0000 mg | ORAL_TABLET | Freq: Every day | ORAL | 1 refills | Status: DC

## 2021-10-23 MED ORDER — OXYBUTYNIN CHLORIDE 5 MG PO TABS: 5.0000 mg | ORAL_TABLET | Freq: Three times a day (TID) | ORAL | 1 refills | Status: DC

## 2021-10-23 MED ORDER — DICYCLOMINE HCL 20 MG PO TABS: ORAL_TABLET | ORAL | 1 refills | Status: DC

## 2021-10-23 NOTE — Chronic Care Management (AMB) (Signed)
Chronic Care Management   Note  10/23/2021 Name: Rosezella Kronick MRN: [id number] DOB: 05/11/1961  Newton Pigg Belay is a 61 y.o. year old female who is a primary care patient of Metheney, Rene Kocher, MD. I reached out to Elaina Pattee by phone today in response to a referral sent by Ms. Newton Pigg Wandel's PCP.  Ms. Ammar was given information about Chronic Care Management services today including:  CCM service includes personalized support from designated clinical staff supervised by her physician, including individualized plan of care and coordination with other care providers 24/7 contact phone numbers for assistance for urgent and routine care needs. Service will only be billed when office clinical staff spend 20 minutes or more in a month to coordinate care. Only one practitioner may furnish and bill the service in a calendar month. The patient may stop CCM services at any time (effective at the end of the month) by phone call to the office staff. The patient is responsible for co-pay (up to 20% after annual deductible is met) if co-pay is required by the individual health plan.   Patient agreed to services and verbal consent obtained.   Follow up plan: Telephone appointment with care management team member scheduled for: 10/25/2021  Julian Hy, [place] Management  Direct Dial: [phone]

## 2021-10-23 NOTE — Telephone Encounter (Signed)
Patient called requesting for refills on all of her medications. Prescriptions sent.

## 2021-10-24 NOTE — Telephone Encounter (Signed)
Care management referral placed that we can hopefully get a social worker involved if this is not successful then we may need to get Adult Protective Services involved ?

## 2021-10-25 ENCOUNTER — Ambulatory Visit (INDEPENDENT_AMBULATORY_CARE_PROVIDER_SITE_OTHER): Payer: Medicare Other | Admitting: *Deleted

## 2021-10-25 DIAGNOSIS — T7401XA Adult neglect or abandonment, confirmed, initial encounter: Secondary | ICD-10-CM

## 2021-10-25 DIAGNOSIS — I10 Essential (primary) hypertension: Secondary | ICD-10-CM

## 2021-10-25 DIAGNOSIS — F418 Other specified anxiety disorders: Secondary | ICD-10-CM

## 2021-10-25 DIAGNOSIS — S22000A Wedge compression fracture of unspecified thoracic vertebra, initial encounter for closed fracture: Secondary | ICD-10-CM

## 2021-10-25 DIAGNOSIS — R262 Difficulty in walking, not elsewhere classified: Secondary | ICD-10-CM

## 2021-10-25 DIAGNOSIS — M8080XA Other osteoporosis with current pathological fracture, unspecified site, initial encounter for fracture: Secondary | ICD-10-CM

## 2021-10-25 DIAGNOSIS — G809 Cerebral palsy, unspecified: Secondary | ICD-10-CM
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
## 2021-10-29 ENCOUNTER — Encounter: Payer: Self-pay | Admitting: *Deleted

## 2021-10-30 ENCOUNTER — Other Ambulatory Visit: Payer: Self-pay | Admitting: *Deleted

## 2021-10-30 NOTE — Patient Outreach (Signed)
[facility]) Care Management ? ?10/30/2021 ? ?Elaina Pattee ?06/29/1961 ?[id number] ? ? ?Telephone Assessment ? ?Initial outreach unsuccessful with the contact numbers noted in [place]. RN able to leave a HIPAA approved voice message to one of the contacts listed. ? ?Will sent outreach letter and attempt another outreach call over the next week for pending services. ? ?Raina Mina, RN ?Care Management Coordinator ?[place] ?Main Office [phone]  ?

## 2021-11-04 ENCOUNTER — Encounter: Payer: Self-pay | Admitting: Family Medicine

## 2021-11-05 NOTE — Telephone Encounter (Signed)
Forward to [place] for her to review. ?

## 2021-11-06 ENCOUNTER — Encounter: Payer: Self-pay | Admitting: *Deleted

## 2021-11-06 ENCOUNTER — Other Ambulatory Visit: Payer: Self-pay | Admitting: *Deleted

## 2021-11-06 NOTE — Patient Instructions (Signed)
Visit Information ° °Thank you for taking time to visit with me today. Please don't hesitate to contact me if I can be of assistance to you before our next scheduled telephone appointment. ° °Following are the goals we discussed today:  ° ° °Take all medications as prescribed °Attend all scheduled provider appointments °Call pharmacy for medication refills 3-7 days in advance of running out of medications °Perform all self care activities independently  °Perform IADL's (shopping, preparing meals, housekeeping, managing finances) independently °Call provider office for new concerns or questions  °  °

## 2021-11-06 NOTE — Patient Outreach (Signed)
[facility] Customer service manager [facility]) Care Management ? ?11/06/2021 ? ?Alanson Aly ?[birth year] ?[id number] ? ? ?Telephone Assessment-Unsuccessful ?Outreach #2 ? ?Rn attempted outreach call however unsuccessful. RN able to leave a HIPAA approved voice message requesting a call back. ? ?Will follow up over the next week for pending services. ? ?Elliot Cousin, RN ?Care Management Coordinator ?[facility] Customer service manager ?Main Office [phone]  ? ? ?

## 2021-11-06 NOTE — Patient Outreach (Signed)
?[facility] Customer service manager [facility]) Care Management ?Telephonic RN Care Manager Note ? ? ?11/06/2021 ?Name:  Anna Bowen MRN:  [id number] DOB:  1961/08/25 ? ?Summary: ?Spoke with pt today and enrolled into the Fall prevention program. Not pt also information with [facility] social worker for [employer]. Referral made to pharmacy for medication management for possible bubble pack for medications. ? ?Recommendations/Changes made from today's visit: ?Pt has certain request for replacement of her wheelchair. Reached out via secure chat to primary to request this DME.  ? ?Subjective: ?Anna Bowen is an 61 y.o. year old female who is a primary patient of Metheney, Barbarann Ehlers, MD. The care management team was consulted for assistance with care management and/or care coordination needs.   ? ?Telephonic RN Care Manager completed Telephone Visit today. ? ?Objective:  ? ?Medications Reviewed Today   ? ? Reviewed by Alejandro Mulling, RN (Registered Nurse) on 11/06/21 at 1415  Med List Status: <None>  ? ?Medication Order Taking? Sig Documenting Provider Last Dose Status Informant  ?AMBULATORY NON FORMULARY MEDICATION [id number] Yes Medication Name: Electric wheelchair with controls on the left, seatbelt, gel cushion, etc Agapito Games, MD Taking Active   ?AMBULATORY NON FORMULARY MEDICATION [id number] Yes Medication Name: disposable underpads 36x36  medium absorbency Agapito Games, MD Taking Active   ?AMBULATORY NON FORMULARY MEDICATION [id number] Yes Medication Name: Adult/youth small underwear Agapito Games, MD Taking Active   ?AMBULATORY NON FORMULARY MEDICATION [id number] Yes Needs personal care services.  Patient is wheelchair-bound secondary to cerebral palsy.  Please fax to [facility]  Currently qualifies for 18.5 hours per week. Agapito Games, MD Taking Active   ?cholecalciferol (VITAMIN D3) 25 MCG (1000 UNIT) tablet [id number] Yes Take 1,000 Units by mouth daily. [provider] Taking Active   ?dicyclomine (BENTYL) 20 MG tablet [id number] Yes TAKE 1 TABLET BY MOUTH 2 TIMES DAILY AS NEEDED. Agapito Games, MD Taking Active   ?lansoprazole (PREVACID SOLUTAB) 30 MG disintegrating tablet [id number] Yes Take 1 tablet (30 mg total) by mouth every morning. Agapito Games, MD Taking Active   ?lisinopril (ZESTRIL) 20 MG tablet [id number] Yes Take 1 tablet (20 mg total) by mouth daily. Agapito Games, MD Taking Active   ?nitrofurantoin (MACRODANTIN) 100 MG capsule [id number] Yes TAKE 1 CAPSULE BY MOUTH EVERY DAY Agapito Games, MD Taking Active   ?nystatin cream (MYCOSTATIN) [id number] Yes Apply 1 application topically 2 (two) times daily. X 10 days Agapito Games, MD Taking Active   ?oxybutynin (DITROPAN) 5 MG tablet [id number] Yes Take 1 tablet (5 mg total) by mouth 3 (three) times daily. Agapito Games, MD Taking Active   ? ?  ?  ? ?  ? ? ? ?SDOH:  (Social Determinants of Health) assessments and interventions performed:  ?SDOH Interventions   ? ?Flowsheet Row Most Recent Value  ?SDOH Interventions   ?Food Insecurity Interventions Intervention Not Indicated  ?Transportation Interventions Intervention Not Indicated  ? ?  ? ? ? ?Care Plan ? ?Review of patient past medical history, allergies, medications, health status, including review of consultants reports, laboratory and other test data, was performed as part of comprehensive evaluation for care management services.  ? ?Care Plan : RN care manager Plan of Care  ?Updates made by Alejandro Mulling, RN since 11/06/2021 12:00 AM  ?  ? ?Problem: Knowledge deficit related to Falls and care coordination needs   ?Priority: High  ?  ? ?Long-Range Goal: Counsellor of  care for management of Falls   ?Start Date: 11/06/2021  ?Expected End Date: 04/25/2022  ?Priority: High  ?Note:   ?Current Barriers:  ?Knowledge Deficits related to plan of care for management of Fall prevention  ? ?RNCM Clinical Goal(s):   ?Patient will verbalize basic understanding of  fall prevention disease process and self health management plan as evidenced by self reporting and chart review ?take all medications exactly as prescribed and will call provider for medication related questions as evidenced by chart review and self reporting  through collaboration with RN Care manager, provider, and care team.  ? ?Interventions: ?Inter-disciplinary care team collaboration (see longitudinal plan of care) ?Evaluation of current treatment plan related to  self management and patient's adherence to plan as established by provider ? ? ?Falls Interventions:  (Status:  New goal.) Long Term Goal ?Provided written and verbal education re: potential causes of falls and Fall prevention strategies ?Reviewed medications and discussed potential side effects of medications such as dizziness and frequent urination ?Advised patient of importance of notifying provider of falls ?Assessed patients knowledge of fall risk prevention secondary to previously provided education ?Provided patient information for fall alert systems ?Screening for signs and symptoms of depression related to chronic disease state  ? ?Patient Goals/Self-Care Activities: ?Take all medications as prescribed ?Attend all scheduled provider appointments ?Call pharmacy for medication refills 3-7 days in advance of running out of medications ?Perform all self care activities independently  ?Perform IADL's (shopping, preparing meals, housekeeping, managing finances) independently ?Call provider office for new concerns or questions  ? ?Follow Up Plan:  Telephone follow up appointment with care management team member scheduled for:  April, 2023 ?The patient has been provided with contact information for the care management team and has been advised to call with any health related questions or concerns.   ?  ? ? ? ?Elliot Cousin, RN ?Care Management Coordinator ?[facility] Customer service manager ?Main Office  [phone]  ? ?

## 2021-11-07 NOTE — Patient Outreach (Addendum)
[facility] Customer service manager [facility]) Care Management ? ?11/07/2021 ? ?Anna Bowen ?[birth year] ?[id number] ? ? ?Referral for medication assistance from [facility] Care Coordinator Elliot Cousin, RN sent to [facility] Care Guides for [place] ? ?Anna Bowen ?[facility]-Care Management Assistant ?[phone]  ?

## 2021-11-08 ENCOUNTER — Ambulatory Visit: Payer: Medicare Other | Admitting: *Deleted

## 2021-11-08 DIAGNOSIS — G809 Cerebral palsy, unspecified: Secondary | ICD-10-CM

## 2021-11-08 DIAGNOSIS — M8080XA Other osteoporosis with current pathological fracture, unspecified site, initial encounter for fracture: Secondary | ICD-10-CM

## 2021-11-08 DIAGNOSIS — R262 Difficulty in walking, not elsewhere classified: Secondary | ICD-10-CM

## 2021-11-08 DIAGNOSIS — S22000A Wedge compression fracture of unspecified thoracic vertebra, initial encounter for closed fracture: Secondary | ICD-10-CM

## 2021-11-08 DIAGNOSIS — F418 Other specified anxiety disorders: Secondary | ICD-10-CM

## 2021-11-08 DIAGNOSIS — I10 Essential (primary) hypertension: Secondary | ICD-10-CM

## 2021-11-09 NOTE — Patient Instructions (Addendum)
Visit Information ? ?Thank you for taking time to visit with me today. Please don't hesitate to contact me if I can be of assistance to you before our next scheduled telephone appointment. ? ?Following are the goals we discussed today:  ?Patient Goals/Self-Care Activities:  ?Work with [employer] on a bi-weekly basis, until approved for [employer], through [employer].   ?Collaboration with Primary Care Physician, Dr. Nani Gasser to submit completed Personal Care Services Application, for her review and signature. ?Collaboration with Primary Care Physician, Dr. Nani Gasser to assist with arranging home health skilled nursing, physical therapy, medical social work, and a bath aid, through [facility] [phone]). ?Contact LCSW directly (# [phone]), if you have questions, need assistance, or if additional social work needs are identified between now and our next scheduled telephone outreach call. ?Follow-Up Date:  11/22/2021 at 2:15 pm ? ?Please call the care guide team at [phone] if you need to cancel or reschedule your appointment.  ? ?If you are experiencing a Mental Health or Behavioral Health Crisis or need someone to talk to, please call the Suicide and Crisis Lifeline: 988 ?call the [place] National Suicide Prevention Lifeline: 725-803-6872 or TTY: 947-779-1661 TTY 734 057 6473) to talk to a trained counselor ?call 1-800-273-TALK (toll free, 24 hour hotline) ?go to [facility] [address], [place] [phone]) ?call the [facility]: [phone] ?call 911  ? ?Patient verbalizes understanding of instructions and care plan provided today and agrees to view in MyChart. Active MyChart status confirmed with patient.   ? ?Danford Bad LCSW ?Licensed Clinical Social Worker ?[facility] [place] ?[phone]  ?
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
## 2021-11-16 ENCOUNTER — Telehealth: Payer: Self-pay | Admitting: *Deleted

## 2021-11-16 NOTE — Telephone Encounter (Signed)
Form completed,faxed,confirmation received and scanned into patient's chart. 

## 2021-11-22 ENCOUNTER — Ambulatory Visit: Payer: Medicare Other | Admitting: *Deleted

## 2021-11-22 DIAGNOSIS — S22000A Wedge compression fracture of unspecified thoracic vertebra, initial encounter for closed fracture: Secondary | ICD-10-CM

## 2021-11-22 DIAGNOSIS — K2100 Gastro-esophageal reflux disease with esophagitis, without bleeding: Secondary | ICD-10-CM

## 2021-11-22 DIAGNOSIS — F418 Other specified anxiety disorders: Secondary | ICD-10-CM

## 2021-11-22 DIAGNOSIS — T7401XA Adult neglect or abandonment, confirmed, initial encounter: Secondary | ICD-10-CM

## 2021-11-22 DIAGNOSIS — I10 Essential (primary) hypertension: Secondary | ICD-10-CM

## 2021-11-22 DIAGNOSIS — S32020A Wedge compression fracture of second lumbar vertebra, initial encounter for closed fracture: Secondary | ICD-10-CM

## 2021-11-22 DIAGNOSIS — M8080XA Other osteoporosis with current pathological fracture, unspecified site, initial encounter for fracture: Secondary | ICD-10-CM

## 2021-11-22 DIAGNOSIS — G809 Cerebral palsy, unspecified: Secondary | ICD-10-CM

## 2021-11-22 DIAGNOSIS — K589 Irritable bowel syndrome without diarrhea: Secondary | ICD-10-CM

## 2021-11-22 NOTE — Patient Instructions (Signed)
Visit Information ? ?Thank you for taking time to visit with me today. Please don't hesitate to contact me if I can be of assistance to you before our next scheduled telephone appointment. ? ?Following are the goals we discussed today:  ?Patient Goals/Self-Care Activities:  ?Please accept all calls from representative with [facility], in an effort to establish Personal Care Services in the home. ? ~ Application for Personal Care Services submitted to [facility] on 11/19/2021. ?Contact LCSW directly (# [phone]), if you have questions, need assistance, or if additional social work needs are identified in the near future.   ?No Follow-Up Required. ? ?Please call the care guide team at [phone] if you need to cancel or reschedule your appointment.  ? ?If you are experiencing a Mental Health or Behavioral Health Crisis or need someone to talk to, please call the Suicide and Crisis Lifeline: 988 ?call the [place] National Suicide Prevention Lifeline: 867-051-4565 or TTY: 984-601-5906 TTY (913)698-1068) to talk to a trained counselor ?call 1-800-273-TALK (toll free, 24 hour hotline) ?go to [facility] [address], [place] [phone]) ?call the [facility]: [phone] ?call 911  ? ?Patient verbalizes understanding of instructions and care plan provided today and agrees to view in MyChart. Active MyChart status confirmed with patient.   ? ?Danford Bad LCSW ?Licensed Clinical Social Worker ?[facility] [place] ?[phone]  ?
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
## 2021-11-28 ENCOUNTER — Other Ambulatory Visit: Payer: Self-pay | Admitting: Family Medicine

## 2021-11-28 ENCOUNTER — Other Ambulatory Visit: Payer: Self-pay | Admitting: *Deleted

## 2021-11-28 DIAGNOSIS — N390 Urinary tract infection, site not specified: Secondary | ICD-10-CM

## 2021-12-04 ENCOUNTER — Emergency Department (INDEPENDENT_AMBULATORY_CARE_PROVIDER_SITE_OTHER)
Admission: EM | Admit: 2021-12-04 | Discharge: 2021-12-04 | Disposition: A | Discharge status: Home / Self Care | Payer: Medicare Other | Source: Home / Self Care | Attending: Family Medicine | Admitting: Family Medicine

## 2021-12-04 ENCOUNTER — Emergency Department (INDEPENDENT_AMBULATORY_CARE_PROVIDER_SITE_OTHER): Payer: Medicare Other

## 2021-12-04 DIAGNOSIS — R262 Difficulty in walking, not elsewhere classified: Secondary | ICD-10-CM

## 2021-12-04 DIAGNOSIS — M810 Age-related osteoporosis without current pathological fracture: Secondary | ICD-10-CM

## 2021-12-04 DIAGNOSIS — M25571 Pain in right ankle and joints of right foot: Secondary | ICD-10-CM | POA: Diagnosis not present

## 2021-12-04 DIAGNOSIS — M25561 Pain in right knee: Secondary | ICD-10-CM | POA: Diagnosis not present

## 2021-12-04 DIAGNOSIS — M79604 Pain in right leg: Secondary | ICD-10-CM | POA: Diagnosis not present

## 2021-12-04 DIAGNOSIS — M7989 Other specified soft tissue disorders: Secondary | ICD-10-CM | POA: Diagnosis not present

## 2021-12-04 HISTORY — DX: Essential (primary) hypertension: I10

## 2021-12-04 HISTORY — DX: Irritable bowel syndrome, unspecified: K58.9

## 2021-12-04 HISTORY — DX: Gastro-esophageal reflux disease without esophagitis: K21.9

## 2021-12-04 MED ORDER — ACETAMINOPHEN 500 MG PO TABS
1000.0000 mg | ORAL_TABLET | Freq: Once | ORAL | Status: AC
  Administered 2021-12-04: 1000 mg via ORAL

## 2021-12-04 MED ORDER — TRAMADOL-ACETAMINOPHEN 37.5-325 MG PO TABS: 1.0000 | ORAL_TABLET | Freq: Four times a day (QID) | ORAL | 0 refills | Status: DC | PRN
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
## 2021-12-04 NOTE — Discharge Instructions (Addendum)
Use ice to painful areas for 20 minutes every couple of hours ?May alternate ice or heat, use what ever is more pain relieving ?May take ibuprofen or Tylenol for moderate pain ?Take Ultracet if needed for severe pain ?Follow-up with your primary care doctor if not improving in a few days ?

## 2021-12-28 ENCOUNTER — Ambulatory Visit: Payer: Medicare Other | Admitting: Family Medicine

## 2022-01-02 ENCOUNTER — Ambulatory Visit (INDEPENDENT_AMBULATORY_CARE_PROVIDER_SITE_OTHER): Payer: Medicare Other | Admitting: *Deleted

## 2022-01-02 ENCOUNTER — Other Ambulatory Visit: Payer: Self-pay | Admitting: *Deleted

## 2022-01-02 DIAGNOSIS — R262 Difficulty in walking, not elsewhere classified: Secondary | ICD-10-CM

## 2022-01-02 DIAGNOSIS — S32020A Wedge compression fracture of second lumbar vertebra, initial encounter for closed fracture: Secondary | ICD-10-CM

## 2022-01-02 DIAGNOSIS — M8080XA Other osteoporosis with current pathological fracture, unspecified site, initial encounter for fracture: Secondary | ICD-10-CM

## 2022-01-02 DIAGNOSIS — G809 Cerebral palsy, unspecified: Secondary | ICD-10-CM

## 2022-01-02 DIAGNOSIS — S22000A Wedge compression fracture of unspecified thoracic vertebra, initial encounter for closed fracture: Secondary | ICD-10-CM

## 2022-01-02 DIAGNOSIS — I10 Essential (primary) hypertension: Secondary | ICD-10-CM

## 2022-01-02 NOTE — Patient Instructions (Addendum)
Visit Information ? ?Thank you for taking time to visit with me today. Please don't hesitate to contact me if I can be of assistance to you before our next scheduled telephone appointment. ? ?Following are the goals we discussed today:  ?Patient Goals/Self-Care Activities:  ?Continue to work with [employer], on a weekly basis, until [employer] are in place, through [employer]. ?LCSW collaboration with Vaughan Basta, representative with [employer] (# [phone]), to confirm that you have been approved for [employer]. ? ~ 80 hours of Personal Care Services will be provided per month (maximum amount allotted). ? ~ [facility], agency of choice, will be providing Personal Care Services. ?Please accept all calls from representative with [facility] [phone]), in an effort to establish Personal Care Services in the home. ? ~ Call should take place within the next 48 hours. ? ~ If unavailable, please return call at your earliest convenience. ?Contact LCSW directly (# [phone]), if you have questions, need assistance, or if additional social work needs are identified in the near future.   ?Follow-Up:  01/10/2022 at 2:15 pm ? ?Please call the care guide team at [phone] if you need to cancel or reschedule your appointment.  ? ?If you are experiencing a Mental Health or Behavioral Health Crisis or need someone to talk to, please call the Suicide and Crisis Lifeline: 988 ?call the [place] National Suicide Prevention Lifeline: 6628473008 or TTY: (726) 609-2747 TTY (619) 314-0122) to talk to a trained counselor ?call 1-800-273-TALK (toll free, 24 hour hotline) ?go to [facility] [address] [place] [phone]) ?call the [facility]: [phone] ?call 911  ? ?Patient verbalizes understanding of instructions and care plan provided today and agrees to view in  MyChart. Active MyChart status confirmed with patient.   ? ?Danford Bad LCSW ?Licensed Clinical Social Worker ?[facility] ?[phone]  ?

## 2022-01-02 NOTE — Patient Outreach (Signed)
?[facility]) Care Management ?Telephonic RN Care Manager Note ? ? ?01/02/2022 ?Name:  Anna Bowen MRN:  [id number] DOB:  1961/04/30 ? ?Summary: ?Verified no falls and pt continue to be mobile with use of her wheelchair with no reported incidents. Will continue to encouraged pt to use safety measures with her transfers and adherence to all prescribed medications. ? ?Recommendations/Changes made from today's visit: ?Encouraged pt to use all assisted devices for fall prevention and have her mobile device nearby for emergencies.  RN will follow up with [facility] social worker to in [place] with [facility] that was referred a few months ago. ? ?Subjective: ?Anna Bowen is an 61 y.o. year old female who is a primary patient of Metheney, Rene Kocher, MD. The care management team was consulted for assistance with care management and/or care coordination needs.   ? ?Telephonic RN Care Manager completed Telephone Visit today. ? ?Objective:  ? ?Medications Reviewed Today   ? ? Reviewed by Francis Gaines, LCSW (Social Worker) on 01/02/22 at 67  Med List Status: <None>  ? ?Medication Order Taking? Sig Documenting Provider Last Dose Status Informant  ?AMBULATORY NON FORMULARY MEDICATION [id number] No Medication Name: Electric wheelchair with controls on the left, seatbelt, gel cushion, etc Hali Marry, MD Taking Active   ?AMBULATORY NON FORMULARY MEDICATION [id number] No Medication Name: disposable underpads 36x36  medium absorbency Hali Marry, MD Taking Active   ?AMBULATORY NON FORMULARY MEDICATION [id number] No Medication Name: Adult/youth small underwear Hali Marry, MD Taking Active   ?AMBULATORY NON FORMULARY MEDICATION [id number] No Needs personal care services.  Patient is wheelchair-bound secondary to cerebral palsy.  Please fax to [facility]  Currently qualifies for 18.5 hours per week. Hali Marry, MD Taking Active    ?cholecalciferol (VITAMIN D3) 25 MCG (1000 UNIT) tablet [id number] No Take 1,000 Units by mouth daily. [provider] Taking Active   ?dicyclomine (BENTYL) 20 MG tablet [id number] No TAKE 1 TABLET BY MOUTH 2 TIMES DAILY AS NEEDED. Hali Marry, MD Taking Active   ?ibuprofen (ADVIL) 400 MG tablet [id number]  Take 400 mg by mouth every 6 (six) hours as needed. [provider]  Active   ?lansoprazole (PREVACID SOLUTAB) 30 MG disintegrating tablet [id number] No Take 1 tablet (30 mg total) by mouth every morning. Hali Marry, MD Taking Active   ?lisinopril (ZESTRIL) 20 MG tablet [id number] No Take 1 tablet (20 mg total) by mouth daily. Hali Marry, MD Taking Active   ?nystatin cream (MYCOSTATIN) [id number] No Apply 1 application topically 2 (two) times daily. X 10 days Hali Marry, MD Taking Active   ?oxybutynin (DITROPAN) 5 MG tablet [id number] No Take 1 tablet (5 mg total) by mouth 3 (three) times daily. Hali Marry, MD Taking Active   ?traMADol-acetaminophen (ULTRACET) 37.5-325 MG tablet [id number]  Take 1-2 tablets by mouth every 6 (six) hours as needed. Raylene Everts, MD  Active   ? ?  ?  ? ?  ? ? ? ?SDOH:  (Social Determinants of Health) assessments and interventions performed:  ? ? ? ?Care Plan ? ?Review of patient past medical history, allergies, medications, health status, including review of consultants reports, laboratory and other test data, was performed as part of comprehensive evaluation for care management services.  ? ?Care Plan : RN care manager Plan of Care  ?Updates made by Tobi Bastos, RN since 01/02/2022 12:00 AM  ?  ? ?Problem:  Knowledge deficit related to Falls and care coordination needs   ?Priority: High  ?  ? ?Long-Range Goal: Development plan of care for management of Falls   ?Start Date: 11/06/2021  ?Expected End Date: 04/25/2022  ?This Visit's Progress: On track  ?Recent Progress: On track  ?Priority: High  ?Note:   ?Current  Barriers:  ?Knowledge Deficits related to plan of care for management of Fall prevention  ? ?RNCM Clinical Goal(s):  ?Patient will verbalize basic understanding of  fall prevention disease process and self health management plan as evidenced by self reporting and chart review ?take all medications exactly as prescribed and will call provider for medication related questions as evidenced by chart review and self reporting  through collaboration with RN Care manager, provider, and care team.  ? ?Interventions: ?Inter-disciplinary care team collaboration (see longitudinal plan of care) ?Evaluation of current treatment plan related to  self management and patient's adherence to plan as established by provider ? ? ?Falls Interventions:  (Status:  Goal on track:  Yes.) Long Term Goal ?Provided written and verbal education re: potential causes of falls and Fall prevention strategies ?Reviewed medications and discussed potential side effects of medications such as dizziness and frequent urination ?Advised patient of importance of notifying provider of falls ?Assessed patients knowledge of fall risk prevention secondary to previously provided education ?Provided patient information for fall alert systems ?Screening for signs and symptoms of depression related to chronic disease state  ? ?4/5 Update- Spoke with pt today who reports no falls, injuries or wounds resented today with sufficient transportation. Pt is receiving assistance from family working on [employer] pending home food delivery. Pt also has [place] social worker Mechele Claude S.) for other community resources. Pt continue to have [facility]) for aide services for ADLs while awaiting CAPs program to start (on waiting list). No immediate needs at this time as pt continue to utilize mobility in her wheelchair with ongoing assistance from her brother, sister and cousin.  ? ?5/10 Update: Spoke with pt today and verified no falls or related injuries however states she  recently had a UTI applying prescription ointment. Running low on this medication however pending PCP appointment on Friday (encouraged pt to refill if needed).  Pt mentioned some irritation to her sacral area and will address this on her appointment with her provider Friday. Pt mentioned the need for aide services indicating she is unable to apply the ointment to the affected areas. [facility] social worker has referral pt for PCA services with [facility] however pt not sure if this services has started. RN will reach out the the social worker to verify the start of services and request a call to the pt with an update on expected services. Plan of care reviewed and discussed with much encouragement to pt to use her assisted devices and continue to seek assistance from her brother (primary caregiver).  ? ?Patient Goals/Self-Care Activities: ?Take all medications as prescribed ?Attend all scheduled provider appointments ?Call pharmacy for medication refills 3-7 days in advance of running out of medications ?Perform all self care activities independently  ?Perform IADL's (shopping, preparing meals, housekeeping, managing finances) independently ?Call provider office for new concerns or questions  ? ?Follow Up Plan:  Telephone follow up appointment with care management team member scheduled for:  June 2023 ?The patient has been provided with contact information for the care management team and has been advised to call with any health related questions or concerns.   ?  ? ? ? ?  Raina Mina, RN ?Care Management Coordinator ?[place] ?Main Office [phone]  ? ? ?

## 2022-01-02 NOTE — Patient Outreach (Signed)
?[facility]) Care Management ?Telephonic RN Care Manager Note ? ? ?11/28/2021 ?Name:  Anna Bowen MRN:  [id number] DOB:  Oct 03, 1960 ? ?Summary: ?Pt reports no falls, injuries or wound present, family working on [employer] pending delivery. Pt also has [facility] social worker Anna Bowen S.) for other community resources, [employer] Frances [place]) for aide services cont' while awaiting CAPs program to start (on waiting list). No immediate needs at this time. ? ?Recommendations/Changes made from today's visit: ?Will continue to encourage ongoing adherence with the discussed plan of care. ? ?Subjective: ?Anna Bowen is an 61 y.o. year old female who is a primary patient of Metheney, Barbarann Ehlers, MD. The care management team was consulted for assistance with care management and/or care coordination needs.   ? ?Telephonic RN Care Manager completed Telephone Visit today. ? ?Objective:  ? ?Medications Reviewed Today   ? ? Reviewed by Karolee Stamps, LCSW (Social Worker) on 11/22/21 at 1054  Med List Status: <None>  ? ?Medication Order Taking? Sig Documenting Provider Last Dose Status Informant  ?AMBULATORY NON FORMULARY MEDICATION [id number] No Medication Name: Electric wheelchair with controls on the left, seatbelt, gel cushion, etc Agapito Games, MD Taking Active   ?AMBULATORY NON FORMULARY MEDICATION [id number] No Medication Name: disposable underpads 36x36  medium absorbency Agapito Games, MD Taking Active   ?AMBULATORY NON FORMULARY MEDICATION [id number] No Medication Name: Adult/youth small underwear Agapito Games, MD Taking Active   ?AMBULATORY NON FORMULARY MEDICATION [id number] No Needs personal care services.  Patient is wheelchair-bound secondary to cerebral palsy.  Please fax to [facility]  Currently qualifies for 18.5 hours per week. Agapito Games, MD Taking Active   ?cholecalciferol (VITAMIN D3) 25 MCG (1000 UNIT) tablet [id number] No Take 1,000 Units by  mouth daily. [provider] Taking Active   ?dicyclomine (BENTYL) 20 MG tablet [id number] No TAKE 1 TABLET BY MOUTH 2 TIMES DAILY AS NEEDED. Agapito Games, MD Taking Active   ?lansoprazole (PREVACID SOLUTAB) 30 MG disintegrating tablet [id number] No Take 1 tablet (30 mg total) by mouth every morning. Agapito Games, MD Taking Active   ?lisinopril (ZESTRIL) 20 MG tablet [id number] No Take 1 tablet (20 mg total) by mouth daily. Agapito Games, MD Taking Active   ?nitrofurantoin (MACRODANTIN) 100 MG capsule [id number] No TAKE 1 CAPSULE BY MOUTH EVERY DAY Agapito Games, MD Taking Active   ?nystatin cream (MYCOSTATIN) [id number] No Apply 1 application topically 2 (two) times daily. X 10 days Agapito Games, MD Taking Active   ?oxybutynin (DITROPAN) 5 MG tablet [id number] No Take 1 tablet (5 mg total) by mouth 3 (three) times daily. Agapito Games, MD Taking Active   ? ?  ?  ? ?  ? ? ? ?SDOH:  (Social Determinants of Health) assessments and interventions performed:  ? ? ? ?Care Plan ? ?Review of patient past medical history, allergies, medications, health status, including review of consultants reports, laboratory and other test data, was performed as part of comprehensive evaluation for care management services.  ? ?Care Plan : RN care manager Plan of Care  ?Updates made by Alejandro Mulling, RN since 01/02/2022 12:00 AM  ?  ? ?Problem: Knowledge deficit related to Falls and care coordination needs   ?Priority: High  ?  ? ?Long-Range Goal: Development plan of care for management of Falls   ?Start Date: 11/06/2021  ?Expected End Date: 04/25/2022  ?This Visit's Progress: On track  ?Priority: High  ?Note:   ?  Current Barriers:  ?Knowledge Deficits related to plan of care for management of Fall prevention  ? ?RNCM Clinical Goal(s):  ?Patient will verbalize basic understanding of  fall prevention disease process and self health management plan as evidenced by self reporting and chart  review ?take all medications exactly as prescribed and will call provider for medication related questions as evidenced by chart review and self reporting  through collaboration with RN Care manager, provider, and care team.  ? ?Interventions: ?Inter-disciplinary care team collaboration (see longitudinal plan of care) ?Evaluation of current treatment plan related to  self management and patient's adherence to plan as established by provider ? ? ?Falls Interventions:  (Status:  Goal on track:  Yes.) Long Term Goal ?Provided written and verbal education re: potential causes of falls and Fall prevention strategies ?Reviewed medications and discussed potential side effects of medications such as dizziness and frequent urination ?Advised patient of importance of notifying provider of falls ?Assessed patients knowledge of fall risk prevention secondary to previously provided education ?Provided patient information for fall alert systems ?Screening for signs and symptoms of depression related to chronic disease state  ? ?4/5 Update- Spoke with pt today who reports no falls, injuries or wounds resented today with sufficient transportation. Pt is receiving assistance from family working on [employer] pending home food delivery. Pt also has [facility] social worker Anna Bowen S.) for other community resources. Pt continue to have [facility] [place]) for aide services for ADLs while awaiting CAPs program to start (on waiting list). No immediate needs at this time as pt continue to utilize mobility in her wheelchair with ongoing assistance from her brother, sister and cousin.  ? ?Patient Goals/Self-Care Activities: ?Take all medications as prescribed ?Attend all scheduled provider appointments ?Call pharmacy for medication refills 3-7 days in advance of running out of medications ?Perform all self care activities independently  ?Perform IADL's (shopping, preparing meals, housekeeping, managing finances) independently ?Call provider  office for new concerns or questions  ? ?Follow Up Plan:  Telephone follow up appointment with care management team member scheduled for:  May 2023 ?The patient has been provided with contact information for the care management team and has been advised to call with any health related questions or concerns.   ?  ? ? ? ?Elliot Cousin, RN ?Care Management Coordinator ?[facility] ?Main Office [phone]  ? ? ? ?
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
## 2022-01-04 ENCOUNTER — Ambulatory Visit (INDEPENDENT_AMBULATORY_CARE_PROVIDER_SITE_OTHER): Payer: Medicare Other | Admitting: Family Medicine

## 2022-01-04 ENCOUNTER — Encounter: Payer: Self-pay | Admitting: Family Medicine

## 2022-01-04 VITALS — BP 93/52 | HR 83

## 2022-01-04 DIAGNOSIS — R21 Rash and other nonspecific skin eruption: Secondary | ICD-10-CM

## 2022-01-04 DIAGNOSIS — G809 Cerebral palsy, unspecified: Secondary | ICD-10-CM | POA: Diagnosis not present

## 2022-01-04 DIAGNOSIS — I10 Essential (primary) hypertension: Secondary | ICD-10-CM

## 2022-01-04 DIAGNOSIS — K2100 Gastro-esophageal reflux disease with esophagitis, without bleeding: Secondary | ICD-10-CM | POA: Diagnosis not present

## 2022-01-04 MED ORDER — LISINOPRIL 10 MG PO TABS: 10.0000 mg | ORAL_TABLET | Freq: Every day | ORAL | 1 refills | Status: DC

## 2022-01-04 MED ORDER — NYSTATIN 100000 UNIT/GM EX CREA: 1.0000 "application " | TOPICAL_CREAM | Freq: Two times a day (BID) | CUTANEOUS | 1 refills | Status: DC

## 2022-01-04 NOTE — Assessment & Plan Note (Signed)
She does take her reflux medication regularly.  She feels she needs it. ?

## 2022-01-04 NOTE — Patient Instructions (Signed)
I am decreasing your blood pressure pill to 10mg .  I sent over a new prescription to take.   ?

## 2022-01-04 NOTE — Assessment & Plan Note (Signed)
Blood pressure is actually on the low side today and it was a little bit low when I saw her in February some gena decrease her lisinopril from 20 mg down to 10 mg.  New prescription sent to pharmacy we will keep an eye on this.  Blood pressures are still a little lower when I see her back then we may discontinue it completely and see if she still needs it.  We did go ahead and get up-to-date blood work today since its been a year since her last set of labs. ?
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
## 2022-01-04 NOTE — Progress Notes (Signed)
? ?Established Patient Office Visit ? ?Subjective   ?Patient ID: Bobie Caris, female    DOB: June 22, 1961  Age: 61 y.o. MRN: [id number] ? ?No chief complaint on file. ? ? ?HPI ? ?She was also seen in April in the urgent care for right leg pain after a possible injury where a nurse was helping to pick her up and move her and she felt pain.  X-rays were negative for any acute fracture. ? ?Hypertension- Pt denies chest pain, SOB, dizziness, or heart palpitations.  Taking meds as directed w/o problems.  Denies medication side effects.   ? ?Last time I saw her she was dealing with significant rash in her groin.  He says it is better but its not completely gone.  She has not had any home health care in almost 2 months so she has been having to sit in wet adult diapers and she is also been holding her stools during the day because she cannot have a bowel movement until her brother gets home to be able to help clean her.  Social work has been working diligently to try to get some resources and in fact [place] and or [facility] Pair should be reaching out to her within the next couple days based on the last social work note from 2 days ago.  She has been using the cream but cannot really put it on herself very well and her brother really does not want to put it on her.  He feels its a little inappropriate to touch her in her groin area.  But she would like a refill on the cream. ? ?We were also able to work on getting more assistance through nurse aide.  Social work has been working diligently to get this approved. ? ?GERD  - she still gets occ heartburn on the PPI.   ? ? ? ?ROS ? ?  ?Objective:  ?  ? ?BP (!) 93/52   Pulse 83   SpO2 99%  ? ? ?Physical Exam ?Vitals and nursing note reviewed.  ?Constitutional:   ?   Appearance: She is well-developed.  ?HENT:  ?   Head: Normocephalic and atraumatic.  ?Cardiovascular:  ?   Rate and Rhythm: Normal rate and regular rhythm.  ?   Heart sounds: Normal heart sounds.  ?Pulmonary:  ?    Effort: Pulmonary effort is normal.  ?   Breath sounds: Normal breath sounds.  ?Skin: ?   General: Skin is warm and dry.  ?Neurological:  ?   Mental Status: She is alert and oriented to person, place, and time.  ?Psychiatric:     ?   Behavior: Behavior normal.  ? ? ? ?No results found for any visits on 01/04/22. ? ? ? ?The 10-year ASCVD risk score (Arnett DK, et al., 2019) is: 2% ? ?  ?Assessment & Plan:  ? ?Problem List Items Addressed This Visit   ? ?  ? Cardiovascular and Mediastinum  ? ESSENTIAL HYPERTENSION, BENIGN - Primary  ?  Blood pressure is actually on the low side today and it was a little bit low when I saw her in February some gena decrease her lisinopril from 20 mg down to 10 mg.  New prescription sent to pharmacy we will keep an eye on this.  Blood pressures are still a little lower when I see her back then we may discontinue it completely and see if she still needs it.  We did go ahead and get up-to-date blood work today since  its been a year since her last set of labs. ? ?  ?  ? Relevant Medications  ? lisinopril (ZESTRIL) 10 MG tablet  ? Other Relevant Orders  ? CBC  ? COMPLETE METABOLIC PANEL WITH GFR  ? TSH  ?  ? Digestive  ? GERD (gastroesophageal reflux disease)  ?  She does take her reflux medication regularly.  She feels she needs it. ? ?  ?  ?  ? Nervous and Auditory  ? Cerebral palsy ([place])  ?  Unfortunately she is wheelchair-bound she is no longer able to use forearm crutches when she I first met her 10 years ago.  She is in a manual wheelchair.  Hopefully we can get personal care services for her reinstated.  She is really in a situation where she is left in the home by herself all day and is soiling herself and is having skin breakdown issues.  So it is really urgent that we get someone there. ? ?  ?  ? ?Other Visit Diagnoses   ? ? Groin rash      ? Relevant Medications  ? nystatin cream (MYCOSTATIN)  ? ?  ? ? ?Rash-we will refill nystatin cream.  It will be more helpful when she can  have a care assistant that is applying regularly so that we can get the rash completely cleared up part of it is just the issue that she is sitting in wet diapers during the day.  She also tends to not eat or drink to avoid urinating which might be contributing to her drop in her blood pressure more recently. ? ?Return in about 3 months (around 04/08/2022) for Hypertension.  ? ? ?Beatrice Lecher, MD ? ?

## 2022-01-05 LAB — COMPLETE METABOLIC PANEL WITH GFR
AG Ratio: 1.4 (calc) (ref 1.0–2.5)
ALT: 9 U/L (ref 6–29)
AST: 15 U/L (ref 10–35)
Albumin: 3.9 g/dL (ref 3.6–5.1)
Alkaline phosphatase (APISO): 111 U/L (ref 37–153)
BUN/Creatinine Ratio: 20 (calc) (ref 6–22)
BUN: 22 mg/dL (ref 7–25)
CO2: 24 mmol/L (ref 20–32)
Calcium: 8.9 mg/dL (ref 8.6–10.4)
Chloride: 105 mmol/L (ref 98–110)
Creat: 1.08 mg/dL — ABNORMAL HIGH (ref 0.50–1.05)
Globulin: 2.8 g/dL (calc) (ref 1.9–3.7)
Glucose, Bld: 89 mg/dL (ref 65–99)
Potassium: 3.8 mmol/L (ref 3.5–5.3)
Sodium: 141 mmol/L (ref 135–146)
Total Bilirubin: 0.5 mg/dL (ref 0.2–1.2)
Total Protein: 6.7 g/dL (ref 6.1–8.1)
eGFR: 59 mL/min/{1.73_m2} — ABNORMAL LOW (ref >=60)

## 2022-01-05 LAB — CBC
HCT: 38 % (ref 35.0–45.0)
Hemoglobin: 12.5 g/dL (ref 11.7–15.5)
MCH: 32.7 pg (ref 27.0–33.0)
MCHC: 32.9 g/dL (ref 32.0–36.0)
MCV: 99.5 fL (ref 80.0–100.0)
MPV: 9.6 fL (ref 7.5–12.5)
Platelets: 224 10*3/uL (ref 140–400)
RBC: 3.82 10*6/uL (ref 3.80–5.10)
RDW: 12.1 % (ref 11.0–15.0)
WBC: 6.9 10*3/uL (ref 3.8–10.8)

## 2022-01-05 LAB — TSH: TSH: 0.44 mIU/L (ref 0.40–4.50)

## 2022-01-05 NOTE — Progress Notes (Signed)
Your lab work is within acceptable range and there are no concerning findings.   ?

## 2022-01-10 ENCOUNTER — Ambulatory Visit: Payer: Medicare Other | Admitting: *Deleted

## 2022-01-10 DIAGNOSIS — M8080XA Other osteoporosis with current pathological fracture, unspecified site, initial encounter for fracture: Secondary | ICD-10-CM

## 2022-01-10 DIAGNOSIS — T7401XA Adult neglect or abandonment, confirmed, initial encounter: Secondary | ICD-10-CM

## 2022-01-10 DIAGNOSIS — G809 Cerebral palsy, unspecified: Secondary | ICD-10-CM

## 2022-01-10 DIAGNOSIS — F418 Other specified anxiety disorders: Secondary | ICD-10-CM

## 2022-01-10 DIAGNOSIS — R262 Difficulty in walking, not elsewhere classified: Secondary | ICD-10-CM

## 2022-01-10 DIAGNOSIS — S32020A Wedge compression fracture of second lumbar vertebra, initial encounter for closed fracture: Secondary | ICD-10-CM

## 2022-01-10 DIAGNOSIS — I10 Essential (primary) hypertension: Secondary | ICD-10-CM

## 2022-01-10 DIAGNOSIS — S22000A Wedge compression fracture of unspecified thoracic vertebra, initial encounter for closed fracture: Secondary | ICD-10-CM
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
## 2022-01-10 NOTE — Patient Instructions (Signed)
Visit Information  Thank you for taking time to visit with me today. Please don't hesitate to contact me if I can be of assistance to you before our next scheduled telephone appointment.  Following are the goals we discussed today:  Patient Goals/Self-Care Activities:  Continue to work with [employer], on a weekly basis, until [employer] are in place, through [employer]. LCSW collaboration with Brenton Grills, representative with [employer] (# [phone]), to confirm that you have been approved for [employer].  ~ 80 hours of [place] will be provided per month (maximum amount allotted).  ~ [facility] by [facility], agency of choice, will be providing [place]. Please accept all calls from representative with [facility]ouched by [facility] [phone]), in an effort to [place] in the home.  ~ Call should take place within the next 48 hours.  ~ If unavailable, please return call at your earliest convenience. LCSW collaboration with representative from [place] by [facility] [phone]), to inquire about start of service date.  ~ Instructed to contact Baxter Flattery, Paramedic with [facility] by [facility], on 01/11/2022 at 9:00 am. Contact LCSW directly (# [phone]), if you have questions, need assistance, or if additional social work needs are identified in the near future.   Follow-Up:  01/11/2022 at 3:30 pm  Please call the care guide team at [phone] if you need to cancel or reschedule your appointment.   If you are experiencing a Mental Health or [place] or need someone to talk to, please call the Suicide and Crisis Lifeline: 988 call the [place] National Suicide Prevention Lifeline: 571 466 1247 or TTY: 442-311-8702 TTY 7140217620) to talk to a trained counselor call 1-800-273-TALK (toll free, 24 hour hotline) go to [facility] [address], [place] [phone]) call the [place]: [phone] call 911   Patient verbalizes understanding of instructions and care plan provided today and agrees to view in [place]. Active MyChart status and patient understanding of how to access instructions and care plan via MyChart confirmed with patient.     [place] Licensed Clinical Social Worker [place] [phone]

## 2022-01-11 ENCOUNTER — Ambulatory Visit: Payer: Medicare Other | Admitting: *Deleted

## 2022-01-11 DIAGNOSIS — S22000A Wedge compression fracture of unspecified thoracic vertebra, initial encounter for closed fracture: Secondary | ICD-10-CM

## 2022-01-11 DIAGNOSIS — F418 Other specified anxiety disorders: Secondary | ICD-10-CM

## 2022-01-11 DIAGNOSIS — K2100 Gastro-esophageal reflux disease with esophagitis, without bleeding: Secondary | ICD-10-CM

## 2022-01-11 DIAGNOSIS — I10 Essential (primary) hypertension: Secondary | ICD-10-CM

## 2022-01-11 DIAGNOSIS — S32020A Wedge compression fracture of second lumbar vertebra, initial encounter for closed fracture: Secondary | ICD-10-CM

## 2022-01-11 DIAGNOSIS — T7401XA Adult neglect or abandonment, confirmed, initial encounter: Secondary | ICD-10-CM

## 2022-01-11 DIAGNOSIS — G809 Cerebral palsy, unspecified: Secondary | ICD-10-CM

## 2022-01-11 DIAGNOSIS — R262 Difficulty in walking, not elsewhere classified: Secondary | ICD-10-CM

## 2022-01-11 NOTE — Patient Instructions (Signed)
Visit Information  Thank you for taking time to visit with me today. Please don't hesitate to contact me if I can be of assistance to you before our next scheduled telephone appointment.  Following are the goals we discussed today:  Patient Goals/Self-Care Activities:  Continue to work with [employer], on a weekly basis, until [employer] are in place, through [employer]. [employer] (# [phone]) has approved you for 80 hours of [place] per month. LCSW collaboration with representative from [place] by [facility] [phone]), to inquire about start of service date.  ~ According to Baxter Flattery, Paramedic with [facility] by [facility], they are unable to provide [place] to you at this time. LCSW collaboration with representative from [place] [facility] [place] [phone]), to request new home health agency of choice.  ~ HIPAA compliant message left on voicemail, as LCSW continues to await a return call. Contact LCSW directly (# [phone]), if you have questions, need assistance, or if additional social work needs are identified in the near future.   Follow-Up:  01/25/2022 at 11:15 am  Please call the care guide team at [phone] if you need to cancel or reschedule your appointment.   If you are experiencing a Mental Health or [place] or need someone to talk to, please call the Suicide and Crisis Lifeline: 988 call the [place] National Suicide Prevention Lifeline: 913-864-4947 or TTY: 9800761346 TTY (251)569-9239) to talk to a trained counselor call 1-800-273-TALK (toll free, 24 hour hotline) go to [facility] [address] [place] [phone]) call the [place]: [phone] call 911   Patient verbalizes understanding of instructions and care plan provided today and agrees to view  in [place]. Active MyChart status and patient understanding of how to access instructions and care plan via MyChart confirmed with patient.     [place] Licensed Clinical Social Worker [place] [phone]
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
## 2022-01-14 ENCOUNTER — Ambulatory Visit: Payer: Medicare Other | Admitting: *Deleted

## 2022-01-14 DIAGNOSIS — S32020A Wedge compression fracture of second lumbar vertebra, initial encounter for closed fracture: Secondary | ICD-10-CM

## 2022-01-14 DIAGNOSIS — G809 Cerebral palsy, unspecified: Secondary | ICD-10-CM

## 2022-01-14 DIAGNOSIS — T7401XA Adult neglect or abandonment, confirmed, initial encounter: Secondary | ICD-10-CM

## 2022-01-14 DIAGNOSIS — R262 Difficulty in walking, not elsewhere classified: Secondary | ICD-10-CM

## 2022-01-14 DIAGNOSIS — I10 Essential (primary) hypertension: Secondary | ICD-10-CM

## 2022-01-14 DIAGNOSIS — S22000A Wedge compression fracture of unspecified thoracic vertebra, initial encounter for closed fracture: Secondary | ICD-10-CM
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
## 2022-01-15 NOTE — Patient Instructions (Signed)
Visit Information  Thank you for taking time to visit with me today. Please don't hesitate to contact me if I can be of assistance to you before our next scheduled telephone appointment.  Following are the goals we discussed today:  Patient Goals/Self-Care Activities: Continue to work with [employer], on a weekly basis, until [employer] are in place, through [employer]. [employer] (# [phone]) has approved you for 80 hours of Personal Care Services per month. LCSW collaboration with representative from [facility] of [facility] [employer] [phone]), to request new home health agency of choice.  ~ Several HIPAA compliant messages left on voicemail, as LCSW continues to await a return call. LCSW collaboration with sister, Cornelious Bryant (# [phone]) to request assistance in getting in contact with representative from [facility] of [facility] [phone]), to request new home health agency of choice.  ~ HIPAA compliant message left on voicemail, as LCSW continues to await a return call. LCSW collaboration with cousin, Barrie Folk [phone]) to request assistance with obtaining Depends.    ~ HIPAA compliant message left on voicemail, as LCSW continues to await a return call. Contact LCSW directly (# [phone]), if you have questions, need assistance, or if additional social work needs are identified in the near future.   Follow-Up:  01/25/2022 at 11:15 am  Please call the care guide team at [phone] if you need to cancel or reschedule your appointment.   If you are experiencing a Mental Health or Behavioral Health Crisis or need someone to talk to, please call the Suicide and Crisis Lifeline: 988 call the [place] National Suicide Prevention Lifeline: 616-799-8100 or TTY: (301)265-6425 TTY (541)840-3923) to talk to a trained counselor call 1-800-273-TALK (toll free, 24 hour hotline) go  to [facility] [address] [place] [phone]) call the [facility] Crisis Line: [phone] call 911   Patient verbalizes understanding of instructions and care plan provided today and agrees to view in MyChart. Active MyChart status and patient understanding of how to access instructions and care plan via MyChart confirmed with patient.     Danford Bad LCSW Licensed Clinical Social Worker [facility] Med [employer] [phone]

## 2022-01-23 DIAGNOSIS — F32A Depression, unspecified: Secondary | ICD-10-CM

## 2022-01-23 DIAGNOSIS — I10 Essential (primary) hypertension: Secondary | ICD-10-CM

## 2022-01-25 ENCOUNTER — Ambulatory Visit (INDEPENDENT_AMBULATORY_CARE_PROVIDER_SITE_OTHER): Payer: Medicare Other | Admitting: *Deleted

## 2022-01-25 DIAGNOSIS — S22000A Wedge compression fracture of unspecified thoracic vertebra, initial encounter for closed fracture: Secondary | ICD-10-CM

## 2022-01-25 DIAGNOSIS — F418 Other specified anxiety disorders: Secondary | ICD-10-CM

## 2022-01-25 DIAGNOSIS — I10 Essential (primary) hypertension: Secondary | ICD-10-CM

## 2022-01-25 DIAGNOSIS — G809 Cerebral palsy, unspecified: Secondary | ICD-10-CM

## 2022-01-25 DIAGNOSIS — M8080XA Other osteoporosis with current pathological fracture, unspecified site, initial encounter for fracture: Secondary | ICD-10-CM

## 2022-01-25 DIAGNOSIS — T7401XA Adult neglect or abandonment, confirmed, initial encounter: Secondary | ICD-10-CM

## 2022-01-25 DIAGNOSIS — S32020A Wedge compression fracture of second lumbar vertebra, initial encounter for closed fracture: Secondary | ICD-10-CM

## 2022-01-25 DIAGNOSIS — R262 Difficulty in walking, not elsewhere classified: Secondary | ICD-10-CM

## 2022-01-28 ENCOUNTER — Telehealth: Payer: Self-pay | Admitting: Family Medicine
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
## 2022-01-28 NOTE — Patient Instructions (Signed)
Visit Information  Thank you for taking time to visit with me today. Please don't hesitate to contact me if I can be of assistance to you before our next scheduled telephone appointment.  Following are the goals we discussed today:  Patient Goals/Self-Care Activities: Continue to work with [employer], on a weekly/bi-weekly basis, until [employer] are in place, through [employer]. [employer] (# [phone]) has approved you for 80 hours of Personal Care Services per month. LCSW collaboration with representative from [facility] of [facility] [employer] [phone]), to request a new list of personal care service agencies in [place].   ~ Fifth outreach call attempted today, with success.  However, representative with [facility] of [facility], was unable to provide LCSW with a list of personal care service agencies in [place], nor was she able to provide a direct contact number for [facility]. ~ HIPAA compliant message left on voicemail for [employer] [phone]), to request a list of personal care service agencies in [place] that are accepting new clients, as well as staff as soon as possible.   ~ LCSW emphasized urgency of request. LCSW continues in her efforts of trying to get patient established with a personal care agency in [place] that is contracted with [employer]. LCSW collaboration with sister, Cornelious Bryant (# [phone]) to request assistance in getting in contact with representative from [facility] of [facility] [phone]), to request new home health agency of choice.  ~ Several HIPAA compliant messages left on voicemail, as LCSW continues to await a return call. LCSW collaboration with cousin, Barrie Folk [phone]; # [phone]), to request assistance with obtaining Depends.    ~ Several HIPAA  compliant message left on voicemail, as LCSW continues to await a return call. LCSW collaboration with pharmacist, to request assistance in obtaining Oxybutynin.  Contact LCSW directly (# [phone]), if you have questions, need assistance, or if additional social work needs are identified in the near future.   Follow-Up:  02/08/2022 at 10:30 am  Please call the care guide team at [phone] if you need to cancel or reschedule your appointment.   If you are experiencing a Mental Health or Behavioral Health Crisis or need someone to talk to, please call the Suicide and Crisis Lifeline: 988 call the [place] National Suicide Prevention Lifeline: 564-113-5835 or TTY: 867-419-9503 TTY 816-776-6771) to talk to a trained counselor call 1-800-273-TALK (toll free, 24 hour hotline) go to [facility] [address], [place] [phone]) call the [facility] Crisis Line: [phone] call 911   Patient verbalizes understanding of instructions and care plan provided today and agrees to view in MyChart. Active MyChart status and patient understanding of how to access instructions and care plan via MyChart confirmed with patient.     Danford Bad LCSW Licensed Clinical Social Worker [facility] Med [employer] [phone]

## 2022-01-28 NOTE — Progress Notes (Signed)
Please call patient and let her know that per our records she should still have a refill on the [facility] at [facility].  She should also have 1 more refill on the dicyclomine.

## 2022-01-28 NOTE — Telephone Encounter (Signed)
Pt advised.  Pt requested that I call the pharmacy and ask them to fill these 2 prescriptions.  Request completed.  Charyl Bigger, CMA

## 2022-01-28 NOTE — Telephone Encounter (Signed)
Please call patient and let her know that per our records she should still have a refill on the [facility] at [facility].  She should also have 1 more refill on the dicyclomine. 

## 2022-01-29 ENCOUNTER — Ambulatory Visit: Payer: Medicare Other | Admitting: *Deleted

## 2022-01-29 DIAGNOSIS — S22000A Wedge compression fracture of unspecified thoracic vertebra, initial encounter for closed fracture: Secondary | ICD-10-CM

## 2022-01-29 DIAGNOSIS — S32020A Wedge compression fracture of second lumbar vertebra, initial encounter for closed fracture: Secondary | ICD-10-CM

## 2022-01-29 DIAGNOSIS — R262 Difficulty in walking, not elsewhere classified: Secondary | ICD-10-CM

## 2022-01-29 DIAGNOSIS — F418 Other specified anxiety disorders: Secondary | ICD-10-CM

## 2022-01-29 DIAGNOSIS — G809 Cerebral palsy, unspecified: Secondary | ICD-10-CM

## 2022-01-29 DIAGNOSIS — T7401XA Adult neglect or abandonment, confirmed, initial encounter: Secondary | ICD-10-CM

## 2022-01-29 DIAGNOSIS — I10 Essential (primary) hypertension: Secondary | ICD-10-CM

## 2022-01-29 NOTE — Patient Instructions (Signed)
Visit Information  Thank you for taking time to visit with me today. Please don't hesitate to contact me if I can be of assistance to you before our next scheduled telephone appointment.  Following are the goals we discussed today:  Patient Goals/Self-Care Activities: Continue to work with [employer], on a weekly/bi-weekly basis, until [employer] are in place, through [employer]. [employer] (# [phone]) has approved you for 80 hours of [place] per month. LCSW collaboration with representative from [employer] (# [phone]), to submit your agency of choice, since [facility] are unable to staff.  ~ Referral for [place] placed with [employer] [phone]). ~ Please contact [employer] (# [phone]) on 02/01/2022, to check the status of your referral for [place] through [employer] [phone]). LCSW collaboration with sister, Verdis Frederickson [phone]) to request assistance in getting in contact with representative from [place] of [facility] [phone]), to request new home health agency of choice.  ~ Several HIPAA compliant messages left on voicemail, as LCSW continues to await a return call. LCSW collaboration with cousin, Brock Ra [phone]; # [phone]), to request assistance with obtaining Depends.    ~ Several HIPAA compliant message left on voicemail, as LCSW continues to await a return call. LCSW collaboration with pharmacist, to request assistance in obtaining Oxybutynin.  Contact LCSW directly (# [phone]), if you have questions, need assistance, or if additional social work needs are identified in the near future.   Follow-Up:  02/08/2022 at 10:30 am  Please call the care guide team at [phone] if you need to cancel or  reschedule your appointment.   If you are experiencing a Mental Health or [place] or need someone to talk to, please call the Suicide and Crisis Lifeline: 988 call the [place] National Suicide Prevention Lifeline: (367) 053-6136 or TTY: 725-117-0721 TTY 8256467145) to talk to a trained counselor call 1-800-273-TALK (toll free, 24 hour hotline) go to [facility] [address], [place] [phone]) call the [place]: [phone] call 911   Patient verbalizes understanding of instructions and care plan provided today and agrees to view in [place]. Active MyChart status and patient understanding of how to access instructions and care plan via MyChart confirmed with patient.     [place] Licensed Clinical Social Worker [place] [phone]
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
## 2022-01-30 ENCOUNTER — Other Ambulatory Visit: Payer: Self-pay | Admitting: *Deleted

## 2022-01-30 NOTE — Patient Outreach (Signed)
[facility]) Care Management Telephonic RN Care Manager Note   01/30/2022 Name:  Anna Bowen MRN:  [id number] DOB:  09/17/1960  Summary: Pt continues to work with [facility] social worker with finding assistance in the home. Pt denies any fall or related issues and continue to be safe in her home with assistance from her brother and sister. Discussed a higher level of care however pt does not wish to consider ALF at this time.  Recommendations/Changes made from today's visit: Encouraged pt to continue to work with her provider and the [facility] involved social worker concerning in-home services. Will review the plan of care and continue to encourage adherence with her ongoing management of care.  Subjective: Anna Bowen is an 61 y.o. year old female who is a primary patient of Metheney, Barbarann Ehlers, MD. The care management team was consulted for assistance with care management and/or care coordination needs.    Telephonic RN Care Manager completed Telephone Visit today.  Objective:   Medications Reviewed Today     Reviewed by Karolee Stamps, LCSW (Social Worker) on 01/29/22 at 1113  Med List Status: <None>   Medication Order Taking? Sig Documenting Provider Last Dose Status Informant  AMBULATORY NON FORMULARY MEDICATION [id number] No Medication Name: Electric wheelchair with controls on the left, seatbelt, gel cushion, etc Agapito Games, MD Taking Active   Kipp Laurence MEDICATION [id number] No Medication Name: disposable underpads 36x36  medium absorbency Agapito Games, MD Taking Active   Kipp Laurence MEDICATION [id number] No Medication Name: Adult/youth small underwear Agapito Games, MD Taking Active   AMBULATORY Clent Demark MEDICATION [id number] No Needs personal care services.  Patient is wheelchair-bound secondary to cerebral palsy.  Please fax to [facility]  Currently qualifies for 18.5 hours per week.  Agapito Games, MD Taking Active   cholecalciferol (VITAMIN D3) 25 MCG (1000 UNIT) tablet [id number] No Take 1,000 Units by mouth daily. [provider] Taking Active   dicyclomine (BENTYL) 20 MG tablet [id number] No TAKE 1 TABLET BY MOUTH 2 TIMES DAILY AS NEEDED. Agapito Games, MD Taking Active   ibuprofen (ADVIL) 400 MG tablet [id number] No Take 400 mg by mouth every 6 (six) hours as needed. [provider] Taking Active   lansoprazole (PREVACID SOLUTAB) 30 MG disintegrating tablet [id number] No Take 1 tablet (30 mg total) by mouth every morning. Agapito Games, MD Taking Active   lisinopril (ZESTRIL) 10 MG tablet [id number]  Take 1 tablet (10 mg total) by mouth daily. Agapito Games, MD  Active   nystatin cream Ambrose Pancoast) [id number]  Apply 1 application. topically 2 (two) times daily. X 10 days Agapito Games, MD  Active   oxybutynin (DITROPAN) 5 MG tablet [id number] No Take 1 tablet (5 mg total) by mouth 3 (three) times daily. Agapito Games, MD Taking Active              SDOH:  (Social Determinants of Health) assessments and interventions performed:     Care Plan  Review of patient past medical history, allergies, medications, health status, including review of consultants reports, laboratory and other test data, was performed as part of comprehensive evaluation for care management services.   Care Plan : RN care manager Plan of Care  Updates made by Alejandro Mulling, RN since 01/30/2022 12:00 AM     Problem: Knowledge deficit related to Falls and care coordination needs   Priority: High     Long-Range  Goal: Development plan of care for management of Falls   Start Date: 11/06/2021  Expected End Date: 04/25/2022  This Visit's Progress: On track  Recent Progress: On track  Priority: High  Note:   Current Barriers:  Knowledge Deficits related to plan of care for management of Fall prevention   RNCM Clinical Goal(s):   Patient will verbalize basic understanding of  fall prevention disease process and self health management plan as evidenced by self reporting and chart review take all medications exactly as prescribed and will call provider for medication related questions as evidenced by chart review and self reporting  through collaboration with RN Care manager, provider, and care team.   Interventions: Inter-disciplinary care team collaboration (see longitudinal plan of care) Evaluation of current treatment plan related to  self management and patient's adherence to plan as established by provider   Falls Interventions:  (Status:  Goal on track:  Yes.) Long Term Goal Provided written and verbal education re: potential causes of falls and Fall prevention strategies Reviewed medications and discussed potential side effects of medications such as dizziness and frequent urination Advised patient of importance of notifying provider of falls Assessed patients knowledge of fall risk prevention secondary to previously provided education Provided patient information for fall alert systems Screening for signs and symptoms of depression related to chronic disease state   4/5 Update- Spoke with pt today who reports no falls, injuries or wounds resented today with sufficient transportation. Pt is receiving assistance from family working on [employer] pending home food delivery. Pt also has [facility] social worker [name] for other community resources. Pt continue to have [facility] for aide services for ADLs while awaiting CAPs program to start (on waiting list). No immediate needs at this time as pt continue to utilize mobility in her wheelchair with ongoing assistance from her brother, sister and cousin.   5/10 Update: Spoke with pt today and verified no falls or related injuries however states she recently had a UTI applying prescription ointment. Running low on this medication however pending PCP appointment  on Friday (encouraged pt to refill if needed).  Pt mentioned some irritation to her sacral area and will address this on her appointment with her provider Friday. Pt mentioned the need for aide services indicating she is unable to apply the ointment to the affected areas. [facility] however pt not sure if this services has started. RN will reach out the the social worker to verify the start of services and request a call to the pt with an update on expected services. Plan of care reviewed and discussed with much encouragement to pt to use her assisted devices and continue to seek assistance from her brother (primary caregiver).   01/30/2022 Update:Spoke with pt today and received an update on her ongoing management of care. Pt report she continue to do well and [facility] receiving assistance in the home and will meet with the worker in Dr. Eppie GibsonMetheny office tomorrow. Pt currently [facility] and if this agency is not successful pt will connect with [place]. Pt remains wheelchair bound denies any issues at this time. Brother and sister continues to assist with food and transportation. RN further discussed ALF however pt does not wish to consider this at this time and decline information. Based upon the current pending of services for in-home care RN will follow up in  a few weeks on the progress. Plan of care reviewed and discussed as RN encouraged adherence. No other issues or needs presented at this time.   Patient Goals/Self-Care Activities: Take all medications as prescribed Attend all scheduled provider appointments Call pharmacy for medication refills 3-7 days in advance of running out of medications Perform all self care activities independently  Perform IADL's (shopping, preparing meals, housekeeping, managing finances) independently Call provider office for new concerns or questions   Follow Up  Plan:  Telephone follow up appointment with care management team member scheduled for:  July 2023 The patient has been provided with contact information for the care management team and has been advised to call with any health related questions or concerns.       Elliot Cousin, RN Care Management Coordinator [facility] Main Office [phone]

## 2022-02-08 ENCOUNTER — Ambulatory Visit: Payer: Medicare Other | Admitting: *Deleted

## 2022-02-08 DIAGNOSIS — I10 Essential (primary) hypertension: Secondary | ICD-10-CM

## 2022-02-08 DIAGNOSIS — R262 Difficulty in walking, not elsewhere classified: Secondary | ICD-10-CM

## 2022-02-08 DIAGNOSIS — F418 Other specified anxiety disorders: Secondary | ICD-10-CM

## 2022-02-08 DIAGNOSIS — S22000A Wedge compression fracture of unspecified thoracic vertebra, initial encounter for closed fracture: Secondary | ICD-10-CM

## 2022-02-08 DIAGNOSIS — G809 Cerebral palsy, unspecified: Secondary | ICD-10-CM

## 2022-02-08 DIAGNOSIS — S32020A Wedge compression fracture of second lumbar vertebra, initial encounter for closed fracture: Secondary | ICD-10-CM

## 2022-02-08 DIAGNOSIS — M8080XA Other osteoporosis with current pathological fracture, unspecified site, initial encounter for fracture: Secondary | ICD-10-CM

## 2022-02-08 DIAGNOSIS — T7401XA Adult neglect or abandonment, confirmed, initial encounter: Secondary | ICD-10-CM
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
## 2022-02-08 NOTE — Patient Instructions (Addendum)
Visit Information  Thank you for taking time to visit with me today. Please don't hesitate to contact me if I can be of assistance to you before our next scheduled telephone appointment.  Following are the goals we discussed today:  Patient Goals/Self-Care Activities: Continue to work with [employer], on a weekly/bi-weekly basis, until [employer] are in place, through [employer]. LCSW collaboration with representative from [employer] (# [phone]), to submit your agency of choice, since [facility] are unable to staff.  ~ Referral for Personal Care Services placed with [employer] [phone]). ~ HIPAA compliant messages left on voicemail for receptionist with [employer] [phone]), to confirm receipt of referral for Personal Care Services, through [employer].  Awaiting a return call. LCSW collaboration with sister, Cornelious Bryant (# [phone]) to request assistance in getting in contact with representative from [facility] [phone]), to request new home health agency of choice.  ~ Several HIPAA compliant messages left on voicemail, as LCSW continues to await a return call. Contact LCSW directly (# [phone]), if you have questions, need assistance, or if additional social work needs are identified in the near future.   Follow-Up:  02/22/2022 at 9:00 am  Please call the care guide team at [phone] if you need to cancel or reschedule your appointment.   If you are experiencing a Mental Health or Behavioral Health Crisis or need someone to talk to, please call the Suicide and Crisis Lifeline: 988 call the [place] National Suicide Prevention Lifeline: 979-485-8539 or TTY: (850) 329-9620 TTY 989-530-2570) to talk to a trained counselor call 1-800-273-TALK (toll free, 24 hour hotline) go to [facility] [address] [place] [phone]) call the [facility] Crisis Line: [phone] call 911   Patient verbalizes understanding of instructions and care plan provided today and agrees to view in MyChart. Active MyChart status and patient understanding of how to access instructions and care plan via MyChart confirmed with patient.     Danford Bad LCSW Licensed Clinical Social Worker [facility] [employer] [phone]

## 2022-02-12 ENCOUNTER — Ambulatory Visit: Payer: Medicare Other | Admitting: *Deleted

## 2022-02-12 DIAGNOSIS — R262 Difficulty in walking, not elsewhere classified: Secondary | ICD-10-CM

## 2022-02-12 DIAGNOSIS — G809 Cerebral palsy, unspecified: Secondary | ICD-10-CM

## 2022-02-12 DIAGNOSIS — T7401XA Adult neglect or abandonment, confirmed, initial encounter: Secondary | ICD-10-CM

## 2022-02-12 DIAGNOSIS — I10 Essential (primary) hypertension: Secondary | ICD-10-CM

## 2022-02-12 DIAGNOSIS — S32020A Wedge compression fracture of second lumbar vertebra, initial encounter for closed fracture: Secondary | ICD-10-CM

## 2022-02-12 DIAGNOSIS — K2100 Gastro-esophageal reflux disease with esophagitis, without bleeding: Secondary | ICD-10-CM

## 2022-02-12 DIAGNOSIS — F418 Other specified anxiety disorders: Secondary | ICD-10-CM

## 2022-02-12 DIAGNOSIS — M8080XA Other osteoporosis with current pathological fracture, unspecified site, initial encounter for fracture: Secondary | ICD-10-CM

## 2022-02-12 DIAGNOSIS — S22000A Wedge compression fracture of unspecified thoracic vertebra, initial encounter for closed fracture: Secondary | ICD-10-CM
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
## 2022-02-12 NOTE — Patient Instructions (Addendum)
Visit Information  Thank you for taking time to visit with me today. Please don't hesitate to contact me if I can be of assistance to you before our next scheduled telephone appointment.  Following are the goals we discussed today:  Patient Goals/Self-Care Activities: Continue to work with [employer], on a bi-weekly basis, until [employer] are in place, through [employer]. LCSW collaboration with representative from [employer], [employer] (# [phone]), to request resubmission of referral to [employer] (# [phone]), as [employer] indicated that they never received the initial referral. Three-way call initiated with representative from [employer] (# [phone]), and representative from [employer] [phone]), to confirm fax (# [phone]) receipt of referral for Personal Care Services to [employer]. LCSW collaboration with Pervis Hocking, representative from [employer] [phone]), who reported that the referral for Personal Care Services is now under review with her administrator, and that a "meet and greet" will be scheduled with you in the near future.  ~ LCSW requested that the "meet and greet" be expedited as soon as possible, expressing concerns for your safety, health, and well-being. Please accept all calls from [facility] [phone]), in an effort to establish Personal Care Services, through [employer] (# [phone]). Contact LCSW directly (# [phone]), if you have questions, need assistance, or if additional social work needs are identified in the near future.   Follow-Up:  02/22/2022 at 9:00 am  Please call the care guide team at [phone] if you need to cancel or reschedule your appointment.   If you are experiencing a Mental Health or Behavioral Health Crisis or need  someone to talk to, please call the Suicide and Crisis Lifeline: 988 call the [place] National Suicide Prevention Lifeline: (971) 088-0620 or TTY: 218 508 5695 TTY (516)223-3809) to talk to a trained counselor call 1-800-273-TALK (toll free, 24 hour hotline) go to [facility] [address] [place] [phone]) call the [facility] Crisis Line: [phone] call 911   Patient verbalizes understanding of instructions and care plan provided today and agrees to view in MyChart. Active MyChart status and patient understanding of how to access instructions and care plan via MyChart confirmed with patient.     Anna Bad LCSW Licensed Clinical Social Worker [facility] [employer] [phone]

## 2022-02-15 ENCOUNTER — Telehealth: Payer: Self-pay | Admitting: *Deleted

## 2022-02-15 ENCOUNTER — Other Ambulatory Visit: Payer: Self-pay | Admitting: *Deleted

## 2022-02-19 ENCOUNTER — Telehealth: Payer: Self-pay

## 2022-02-19 ENCOUNTER — Ambulatory Visit: Payer: Medicare Other | Admitting: *Deleted

## 2022-02-19 DIAGNOSIS — M8080XA Other osteoporosis with current pathological fracture, unspecified site, initial encounter for fracture: Secondary | ICD-10-CM

## 2022-02-19 DIAGNOSIS — G809 Cerebral palsy, unspecified: Secondary | ICD-10-CM

## 2022-02-19 DIAGNOSIS — S32020A Wedge compression fracture of second lumbar vertebra, initial encounter for closed fracture: Secondary | ICD-10-CM

## 2022-02-19 DIAGNOSIS — T7401XA Adult neglect or abandonment, confirmed, initial encounter: Secondary | ICD-10-CM

## 2022-02-19 DIAGNOSIS — I10 Essential (primary) hypertension: Secondary | ICD-10-CM

## 2022-02-19 DIAGNOSIS — K2100 Gastro-esophageal reflux disease with esophagitis, without bleeding: Secondary | ICD-10-CM

## 2022-02-19 DIAGNOSIS — R262 Difficulty in walking, not elsewhere classified: Secondary | ICD-10-CM

## 2022-02-19 DIAGNOSIS — S22000A Wedge compression fracture of unspecified thoracic vertebra, initial encounter for closed fracture: Secondary | ICD-10-CM

## 2022-02-19 DIAGNOSIS — F418 Other specified anxiety disorders: Secondary | ICD-10-CM

## 2022-02-22 ENCOUNTER — Other Ambulatory Visit: Payer: Self-pay | Admitting: *Deleted

## 2022-02-22 ENCOUNTER — Ambulatory Visit: Payer: Medicare Other | Admitting: *Deleted

## 2022-02-22 DIAGNOSIS — S32020A Wedge compression fracture of second lumbar vertebra, initial encounter for closed fracture: Secondary | ICD-10-CM

## 2022-02-22 DIAGNOSIS — G809 Cerebral palsy, unspecified: Secondary | ICD-10-CM

## 2022-02-22 DIAGNOSIS — T7401XA Adult neglect or abandonment, confirmed, initial encounter: Secondary | ICD-10-CM

## 2022-02-22 DIAGNOSIS — F418 Other specified anxiety disorders: Secondary | ICD-10-CM

## 2022-02-22 DIAGNOSIS — R262 Difficulty in walking, not elsewhere classified: Secondary | ICD-10-CM

## 2022-02-22 DIAGNOSIS — M8080XA Other osteoporosis with current pathological fracture, unspecified site, initial encounter for fracture: Secondary | ICD-10-CM

## 2022-02-22 DIAGNOSIS — I10 Essential (primary) hypertension: Secondary | ICD-10-CM

## 2022-02-22 DIAGNOSIS — N390 Urinary tract infection, site not specified: Secondary | ICD-10-CM

## 2022-02-22 MED ORDER — NITROFURANTOIN MACROCRYSTAL 100 MG PO CAPS: ORAL_CAPSULE | ORAL | 1 refills | Status: DC
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
## 2022-02-25 NOTE — Patient Instructions (Signed)
Visit Information  Thank you for taking time to visit with me today. Please don't hesitate to contact me if I can be of assistance to you before our next scheduled telephone appointment.  Following are the goals we discussed today:  Patient Goals/Self-Care Activities: Continue to work with [employer], [employer], until [employer] are in place, through [employer]. LCSW collaboration with [employer], [employer], to request additional assistance in helping you establish Personal Care Services, through a Personal Care Service Provider of choice, approved through [employer], that has immediate staffing and availability to provide 80 hours of Personal Care Services per month. My regrets to you that my assignment has changed, and that I will no longer be providing social work services to you, [name] ([facility]), or it's providers. Contact LCSW directly (# [id number]), if you have questions, need assistance, or if additional social work needs are identified in the near future.   No Follow-Up Required.  Please call the care guide team at [phone] if you need to cancel or reschedule your appointment.   If you are experiencing a Mental Health or Behavioral Health Crisis or need someone to talk to, please call the Suicide and Crisis Lifeline: 988 call the [place] National Suicide Prevention Lifeline: 678-723-7325 or TTY: 2051932892 TTY 269-690-4950) to talk to a trained counselor call 1-800-273-TALK (toll free, 24 hour hotline) go to [facility] [address], [place] [phone]) call the [facility] Crisis Line: [phone] call 911   Patient verbalizes understanding of instructions and care plan provided today and agrees to view in MyChart. Active MyChart status and patient understanding of how to access instructions and care plan via  MyChart confirmed with patient.     Danford Bad LCSW Licensed Clinical Social Worker [facility] [employer] [phone]

## 2022-03-06 ENCOUNTER — Telehealth: Payer: Self-pay

## 2022-03-06 NOTE — Telephone Encounter (Signed)
   Telephone encounter was:  Successful.  03/06/2022 Name: Zanyah Lentsch MRN: [id number] DOB: 1960-12-04  Daksha Koone is a 61 y.o. year old female who is a primary care patient of Metheney, Barbarann Ehlers, MD . The community resource team was consulted for assistance with  in home care  Care guide performed the following interventions: Patient provided with information about care guide support team and interviewed to confirm resource needs.  Follow Up Plan:  Care guide will follow up with patient by phone over the next week    [facility] Guide, Embedded Care Coordination [facility], Care Management  [phone] [address] [place], [place], [place] [address] Phone: [phone] Email: [email or web address]

## 2022-03-13 ENCOUNTER — Telehealth: Payer: Self-pay

## 2022-03-13 NOTE — Telephone Encounter (Signed)
   Telephone encounter was:  Successful.  03/13/2022 Name: Tapanga Ottaway MRN: [id number] DOB: Feb 16, 1961  Deloria Brassfield is a 61 y.o. year old female who is a primary care patient of Metheney, Barbarann Ehlers, MD . The community resource team was consulted for assistance with Caregiver Stress  Care guide performed the following interventions: Patient provided with information about care guide support team and interviewed to confirm resource needs.Follow up for mailed resources, the Patient did receive the resources   Follow Up Plan:  No further follow up planned at this time. The patient has been provided with needed resources.    Lenard Forth Care Guide, Embedded Care Coordination [facility], Care Management  [phone] [address] [place], [place], [place] [address] Phone: [phone] Email: [email or web address]

## 2022-04-12 ENCOUNTER — Ambulatory Visit: Payer: Medicare Other | Admitting: Family Medicine

## 2022-04-25 ENCOUNTER — Other Ambulatory Visit: Payer: Self-pay | Admitting: Family Medicine

## 2022-04-25 DIAGNOSIS — K589 Irritable bowel syndrome without diarrhea: Secondary | ICD-10-CM

## 2022-04-25 DIAGNOSIS — N3281 Overactive bladder: Secondary | ICD-10-CM

## 2022-04-26 ENCOUNTER — Other Ambulatory Visit: Payer: Self-pay

## 2022-04-26 DIAGNOSIS — I10 Essential (primary) hypertension: Secondary | ICD-10-CM

## 2022-04-26 MED ORDER — LANSOPRAZOLE 30 MG PO TBDD
30.0000 mg | DELAYED_RELEASE_TABLET | Freq: Every morning | ORAL | 1 refills | Status: DC
Start: 2022-04-26 — End: 2022-07-02

## 2022-04-26 MED ORDER — LISINOPRIL 10 MG PO TABS: 10.0000 mg | ORAL_TABLET | Freq: Every day | ORAL | 1 refills | Status: DC

## 2022-06-19 ENCOUNTER — Telehealth: Payer: Self-pay | Admitting: *Deleted

## 2022-07-02 ENCOUNTER — Telehealth (INDEPENDENT_AMBULATORY_CARE_PROVIDER_SITE_OTHER): Payer: Medicare Other | Admitting: Family Medicine

## 2022-07-02 ENCOUNTER — Encounter: Payer: Self-pay | Admitting: Family Medicine

## 2022-07-02 DIAGNOSIS — K2100 Gastro-esophageal reflux disease with esophagitis, without bleeding: Secondary | ICD-10-CM | POA: Diagnosis not present

## 2022-07-02 DIAGNOSIS — G809 Cerebral palsy, unspecified: Secondary | ICD-10-CM | POA: Diagnosis not present

## 2022-07-02 DIAGNOSIS — I10 Essential (primary) hypertension: Secondary | ICD-10-CM | POA: Diagnosis not present

## 2022-07-02 MED ORDER — PANTOPRAZOLE SODIUM 40 MG PO TBEC: 40.0000 mg | DELAYED_RELEASE_TABLET | Freq: Every day | ORAL | 3 refills | Status: DC

## 2022-07-02 NOTE — Progress Notes (Signed)
   Virtual Visit via Telephone Note  I connected with Anna Bowen on 07/02/22 at  3:00 PM EST by telephone and verified that I am speaking with the correct person using two identifiers.   I discussed the limitations, risks, security and privacy concerns of performing an evaluation and management service by telephone and the availability of in person appointments. I also discussed with the patient that there may be a patient responsible charge related to this service. The patient expressed understanding and agreed to proceed.  Patient location: Provider loccation: In office   Subjective:    CC:   Chief Complaint  Patient presents with   Hypertension    HPI:  Hypertension- Pt denies chest pain, SOB, dizziness, or heart palpitations.  Taking meds as directed w/o problems.  Denies medication side effects.    She has been getting home aid assistance 7 days per week. [employer].    GERD - says her prevacid is helping.  Says occ gets a burning sensation when he swallows.   OAB - still taking her ditropan 3 x a day.  Says it really helps. Needs forms completed for pads   Past medical history, Surgical history, Family history not pertinant except as noted below, Social history, Allergies, and medications have been entered into the medical record, reviewed, and corrections made.   Review of Systems: No fevers, chills, night sweats, weight loss, chest pain, or shortness of breath.   Objective:    General: Speaking clearly in complete sentences without any shortness of breath.  Alert and oriented x3.  Normal judgment. No apparent acute distress.    Impression and Recommendations:    Problem List Items Addressed This Visit       Cardiovascular and Mediastinum   ESSENTIAL HYPERTENSION, BENIGN - Primary   Relevant Orders   BASIC METABOLIC PANEL WITH GFR   TSH + free T4   Lipid Panel w/reflex Direct LDL     Digestive   GERD (gastroesophageal reflux disease)   Relevant  Medications   pantoprazole (PROTONIX) 40 MG tablet     Nervous and Auditory   Cerebral palsy (HCC)    Meds ordered this encounter  Medications   pantoprazole (PROTONIX) 40 MG tablet    Sig: Take 1 tablet (40 mg total) by mouth daily.    Dispense:  90 tablet    Refill:  3    Meds ordered this encounter  Medications   pantoprazole (PROTONIX) 40 MG tablet    Sig: Take 1 tablet (40 mg total) by mouth daily.    Dispense:  90 tablet    Refill:  3     I discussed the assessment and treatment plan with the patient. The patient was provided an opportunity to ask questions and all were answered. The patient agreed with the plan and demonstrated an understanding of the instructions.   The patient was advised to call back or seek an in-person evaluation if the symptoms worsen or if the condition fails to improve as anticipated.  I provided 20 minutes of non-face-to-face time during this encounter.   Beatrice Lecher, MD

## 2022-07-02 NOTE — Progress Notes (Signed)
Called pt LVM advising that I was calling to do her prescreening.  Pt would like a prescription for poise pads. She feels that this may be helpful with her incontinence. She has used these before and said that her brother has purchased these but if she gets a prescription she feels that it could be covered by her insurance and would possibly be cheaper.

## 2022-07-08 ENCOUNTER — Ambulatory Visit (INDEPENDENT_AMBULATORY_CARE_PROVIDER_SITE_OTHER): Payer: Medicare Other | Admitting: Family Medicine

## 2022-07-08 ENCOUNTER — Telehealth: Payer: Self-pay

## 2022-07-08 DIAGNOSIS — Z0000 Encounter for general adult medical examination without abnormal findings: Secondary | ICD-10-CM | POA: Diagnosis not present

## 2022-07-08 NOTE — Telephone Encounter (Signed)
   Telephone encounter was:  Successful.  07/08/2022 Name: Anna Bowen MRN: [id number] DOB: 1961/03/27  Anna Bowen is a 61 y.o. year old female who is a primary care patient of Metheney, Barbarann Ehlers, MD . The community resource team was consulted for assistance with Transportation Needs   Care guide performed the following interventions: Patient provided with information about care guide support team and interviewed to confirm resource needs.Patient stated she needs Transportation resources for [facility]. Patient has no appointments until May to get transportation set up, no transportation needs at this time. I have mailed resources.  Follow Up Plan:  No follow up needed    Lenard Forth [facility] Guide, [facility], Care Management  [phone] [address] [place], [place], [place] [address] Phone: [phone] Email: [name]

## 2022-07-08 NOTE — Patient Instructions (Signed)
MEDICARE ANNUAL WELLNESS VISIT Health Maintenance Summary and Written Plan of Care  Anna Bowen ,  Thank you for allowing me to perform your Medicare Annual Wellness Visit and for your ongoing commitment to your health.   Health Maintenance & Immunization History Health Maintenance  Topic Date Due   INFLUENZA VACCINE  11/24/2022 (Originally 03/26/2022)   MAMMOGRAM  07/03/2023 (Originally 12/12/2018)   COLONOSCOPY (Pts 45-88yrs Insurance coverage will need to be confirmed)  07/09/2023 (Originally 01/19/2006)   TETANUS/TDAP  07/09/2023 (Originally 05/04/2019)   PAP SMEAR-Modifier  09/05/2023 (Originally 01/19/1982)   Zoster Vaccines- Shingrix (1 of 2) 10/03/2023 (Originally 01/20/1980)   Medicare Annual Wellness (AWV)  07/09/2023   Hepatitis C Screening  Completed   HIV Screening  Completed   Pneumococcal Vaccine 76-8 Years old  Aged Out   HPV VACCINES  Aged Out   Immunization History  Administered Date(s) Administered   Td 05/03/2009    These are the patient goals that we discussed:  Goals Addressed               This Visit's Progress     Patient Stated (pt-stated)        07/08/2022 AWV Goal: Improved Nutrition/Diet  Patient will verbalize understanding that diet plays an important role in overall health and that a poor diet is a risk factor for many chronic medical conditions.  Over the next year, patient will improve self management of their diet by incorporating more water. Patient will utilize available community resources to help with food acquisition if needed (ex: food pantries, [address], etc) Patient will work with nutrition specialist if a referral was made          This is a list of Health Maintenance Items that are overdue or due now: Influenza vaccine Td vaccine Screening mammography Screening Pap smear and pelvic exam  Colorectal cancer screening Shingrix vaccine    Orders/Referrals Placed Today: No orders of the defined types were placed in this  encounter.  (Contact our referral department at [phone] if you have not spoken with someone about your referral appointment within the next 5 days)    Follow-up Plan Follow-up with Agapito Games, MD as planned Due to lack of transportation, patient is not able to complete the preventative screenings but would like to complete them. Referral placed for community care coordinator. Medicare wellness visit in one year.  AVS printed and mailed to the patient.      Health Maintenance, Female Adopting a healthy lifestyle and getting preventive care are important in promoting health and wellness. Ask your health care provider about: The right schedule for you to have regular tests and exams. Things you can do on your own to prevent diseases and keep yourself healthy. What should I know about diet, weight, and exercise? Eat a healthy diet  Eat a diet that includes plenty of vegetables, fruits, low-fat dairy products, and lean protein. Do not eat a lot of foods that are high in solid fats, added sugars, or sodium. Maintain a healthy weight Body mass index (BMI) is used to identify weight problems. It estimates body fat based on height and weight. Your health care provider can help determine your BMI and help you achieve or maintain a healthy weight. Get regular exercise Get regular exercise. This is one of the most important things you can do for your health. Most adults should: Exercise for at least 150 minutes each week. The exercise should increase your heart rate and make you sweat (  moderate-intensity exercise). Do strengthening exercises at least twice a week. This is in addition to the moderate-intensity exercise. Spend less time sitting. Even light physical activity can be beneficial. Watch cholesterol and blood lipids Have your blood tested for lipids and cholesterol at 61 years of age, then have this test every 5 years. Have your cholesterol levels checked more often  if: Your lipid or cholesterol levels are high. You are older than 61 years of age. You are at high risk for heart disease. What should I know about cancer screening? Depending on your health history and family history, you may need to have cancer screening at various ages. This may include screening for: Breast cancer. Cervical cancer. Colorectal cancer. Skin cancer. Lung cancer. What should I know about heart disease, diabetes, and high blood pressure? Blood pressure and heart disease High blood pressure causes heart disease and increases the risk of stroke. This is more likely to develop in people who have high blood pressure readings or are overweight. Have your blood pressure checked: Every 3-5 years if you are 62-56 years of age. Every year if you are 6 years old or older. Diabetes Have regular diabetes screenings. This checks your fasting blood sugar level. Have the screening done: Once every three years after age 72 if you are at a normal weight and have a low risk for diabetes. More often and at a younger age if you are overweight or have a high risk for diabetes. What should I know about preventing infection? Hepatitis B If you have a higher risk for hepatitis B, you should be screened for this virus. Talk with your health care provider to find out if you are at risk for hepatitis B infection. Hepatitis C Testing is recommended for: Everyone born from [birth year] through 1965. Anyone with known risk factors for hepatitis C. Sexually transmitted infections (STIs) Get screened for STIs, including gonorrhea and chlamydia, if: You are sexually active and are younger than 61 years of age. You are older than 61 years of age and your health care provider tells you that you are at risk for this type of infection. Your sexual activity has changed since you were last screened, and you are at increased risk for chlamydia or gonorrhea. Ask your health care provider if you are at risk. Ask  your health care provider about whether you are at high risk for HIV. Your health care provider may recommend a prescription medicine to help prevent HIV infection. If you choose to take medicine to prevent HIV, you should first get tested for HIV. You should then be tested every 3 months for as long as you are taking the medicine. Pregnancy If you are about to stop having your period (premenopausal) and you may become pregnant, seek counseling before you get pregnant. Take 400 to 800 micrograms (mcg) of folic acid every day if you become pregnant. Ask for birth control (contraception) if you want to prevent pregnancy. Osteoporosis and menopause Osteoporosis is a disease in which the bones lose minerals and strength with aging. This can result in bone fractures. If you are 70 years old or older, or if you are at risk for osteoporosis and fractures, ask your health care provider if you should: Be screened for bone loss. Take a calcium or vitamin D supplement to lower your risk of fractures. Be given hormone replacement therapy (HRT) to treat symptoms of menopause. Follow these instructions at home: Alcohol use Do not drink alcohol if: Your health care provider tells  you not to drink. You are pregnant, may be pregnant, or are planning to become pregnant. If you drink alcohol: Limit how much you have to: 0-1 drink a day. Know how much alcohol is in your drink. In the [place] one drink equals one 12 oz bottle of beer (355 mL), one 5 oz glass of wine (148 mL), or one 1 oz glass of hard liquor (44 mL). Lifestyle Do not use any products that contain nicotine or tobacco. These products include cigarettes, chewing tobacco, and vaping devices, such as e-cigarettes. If you need help quitting, ask your health care provider. Do not use street drugs. Do not share needles. Ask your health care provider for help if you need support or information about quitting drugs. General instructions Schedule regular  health, dental, and eye exams. Stay current with your vaccines. Tell your health care provider if: You often feel depressed. You have ever been abused or do not feel safe at home. Summary Adopting a healthy lifestyle and getting preventive care are important in promoting health and wellness. Follow your health care provider's instructions about healthy diet, exercising, and getting tested or screened for diseases. Follow your health care provider's instructions on monitoring your cholesterol and blood pressure. This information is not intended to replace advice given to you by your health care provider. Make sure you discuss any questions you have with your health care provider. Document Revised: 01/01/2021 Document Reviewed: 01/01/2021 Elsevier Patient Education  2023 [employer].

## 2022-07-08 NOTE — Progress Notes (Signed)
MEDICARE ANNUAL WELLNESS VISIT  07/08/2022  Telephone Visit Disclaimer This Medicare AWV was conducted by telephone due to national recommendations for restrictions regarding the COVID-19 Pandemic (e.g. social distancing).  I verified, using two identifiers, that I am speaking with Anna AlyGayle Denise Maron or their authorized healthcare agent. I discussed the limitations, risks, security, and privacy concerns of performing an evaluation and management service by telephone and the potential availability of an Anna-person appointment Anna the future. The patient expressed understanding and agreed to proceed.  Location of Patient: Home Location of Provider (nurse):  Provider home  Subjective:    Anna Bowen is a 61 y.o. female patient of Metheney, Barbarann Ehlersatherine D, MD who had a Medicare Annual Wellness Visit today via telephone. Anna Bowen is Legally disabled and lives with her brother. she does not have any children. she reports that she is socially active and does interact with friends/family regularly. she is not physically active and enjoys knitting and doing word search.  Patient Care Team: Agapito GamesMetheney, Catherine D, MD as PCP - General     07/08/2022   10:42 AM 11/06/2021    1:59 PM 10/27/2021   12:20 PM 10/26/2019    2:39 PM 06/29/2018    1:12 PM  Advanced Directives  Does Patient Have a Medical Advance Directive? No Yes Yes No No  Type of Special educational needs teacherAdvance Directive  Healthcare Power of [place]orney;Living will Healthcare Power of [place];Living will    Does patient want to make changes to medical advance directive?   No - Patient declined    Copy of Healthcare Power of Attorney Anna Chart?  No - copy requested No - copy requested    Would patient like information on creating a medical advance directive? No - Patient declined   No - Patient declined Yes (MAU/Ambulatory/Procedural Areas - Information given)    Hospital Utilization Over the Past 12 Months: # of hospitalizations or ER visits: 0 # of surgeries:  0  Review of Systems    Patient reports that her overall Bowen is unchanged compared to last year.  History obtained from chart review and the patient  Patient Reported Readings (BP, Pulse, CBG, Weight, etc) none  Pain Assessment Pain : No/denies pain     Current Medications & Allergies (verified) Allergies as of 07/08/2022       Reactions   Penicillins Rash        Medication List        Accurate as of July 08, 2022 11:04 AM. If you have any questions, ask your nurse or doctor.          AMBULATORY NON FORMULARY MEDICATION Medication Name: Electric wheelchair with controls on the left, seatbelt, gel cushion, etc   AMBULATORY NON FORMULARY MEDICATION Medication Name: disposable underpads 36x36  medium absorbency   AMBULATORY NON FORMULARY MEDICATION Medication Name: Adult/youth small underwear   AMBULATORY NON FORMULARY MEDICATION Needs personal care services.  Patient is wheelchair-bound secondary to cerebral palsy.  [facility]  Currently qualifies for 18.5 hours per week.   dicyclomine 20 MG tablet Commonly known as: BENTYL TAKE 1 TABLET BY MOUTH TWICE A DAY AS NEEDED   ibuprofen 400 MG tablet Commonly known as: ADVIL Take 400 mg by mouth every 6 (six) hours as needed.   lisinopril 10 MG tablet Commonly known as: ZESTRIL Take 1 tablet (10 mg total) by mouth daily.   nitrofurantoin 100 MG capsule Commonly known as: MACRODANTIN TAKE 1 CAPSULE BY MOUTH EVERY DAY  nystatin cream Commonly known as: MYCOSTATIN Apply 1 application. topically 2 (two) times daily. X 10 days   oxybutynin 5 MG tablet Commonly known as: DITROPAN TAKE 1 TABLET BY MOUTH THREE TIMES A DAY   pantoprazole 40 MG tablet Commonly known as: PROTONIX Take 1 tablet (40 mg total) by mouth daily.        History (reviewed): Past Medical History:  Diagnosis Date   Cerebral palsy (HCC)    since birth   GERD (gastroesophageal reflux disease)     Hypertension    IBS (irritable bowel syndrome)    Thyroid disease    Past Surgical History:  Procedure Laterality Date   LEG SURGERY Bilateral    Family History  Problem Relation Age of Onset   Osteoporosis Mother    Lung disease Mother        Restrictive\   Hypertension Mother    Cancer Father        brain tumor died age 39   Diabetes Brother    Social History   Socioeconomic History   Marital status: Single    Spouse name: Not on file   Number of children: 0   Years of education: 12   Highest education level: 12th grade  Occupational History   Occupation: disability    Comment: [employer]  Tobacco Use   Smoking status: Never    Passive exposure: Current   Smokeless tobacco: Never  Vaping Use   Vaping Use: Never used  Substance and Sexual Activity   Alcohol use: No   Drug use: No   Sexual activity: Not Currently  Other Topics Concern   Not on file  Social History Narrative   Lives with her brother. She enjoys knitting and Does word searches at home to help stimulate the brain.    Social Determinants of Bowen   Financial Resource Strain: Low Risk  (07/08/2022)   Overall Financial Resource Strain (CARDIA)    Difficulty of Paying Living Expenses: Not hard at all  Food Insecurity: No Food Insecurity (07/08/2022)   Hunger Vital Sign    Worried About Running Out of Food Anna the Last Year: Never true    Ran Out of Food Anna the Last Year: Never true  Transportation Needs: Unmet Transportation Needs (07/08/2022)   PRAPARE - Administrator, Civil Service (Medical): Yes    Lack of Transportation (Non-Medical): Yes  Physical Activity: Inactive (07/08/2022)   Exercise Vital Sign    Days of Exercise per Week: 0 days    Minutes of Exercise per Session: 0 min  Stress: No Stress Concern Present (07/08/2022)   [employer] of Occupational Bowen - Occupational Stress Bowen    Feeling of Stress : Not at all  Social Connections: Socially Isolated  (07/08/2022)   Social Connection and Isolation Panel [NHANES]    Frequency of Communication with Friends and Family: Once a week    Frequency of Social Gatherings with Friends and Family: More than three times a week    Attends Religious Services: Never    Database administrator or Organizations: No    Attends Banker Meetings: Never    Marital Status: Never married    Activities of Daily Living    07/08/2022   10:42 AM 11/06/2021    2:06 PM  Anna your present state of health, Anna you have any difficulty performing the following activities:  Hearing? 0 0  Vision? 0 0  Difficulty concentrating or making decisions? 0 0  Walking or climbing stairs? 1 1  Comment patient is Anna a wheelchair. Needs assist with this task  Dressing or bathing? 1 1  Comment her aide helps with that. Assistance with this task  Doing errands, shopping? 1   Comment she doesn't drive. Assistance with this task  Preparing Food and eating ? Malvin Johns  Comment her aide helps her with that. Cerbral palsy  Using the Toilet? Malvin Johns  Comment her aide helps with that. Cerbal Palsy  Anna the past six months, have you accidently leaked urine? N Y  Comment  Cerbal Palsy  Anna you have problems with loss of bowel control? N N  Managing your Medications? Y N  Comment her aide helps with that.   Managing your Finances? Y N  Comment her brother helps with that. Brother handle all pt's finances  Housekeeping or managing your Housekeeping? Malvin Johns  Comment her aide helps with house keeping. Cerbal Palsy    Patient Education/ Literacy How often Anna you need to have someone help you when you read instructions, pamphlets, or other written materials from your doctor or pharmacy?: 1 - Never What is the last grade level you completed Anna school?: 12th grade  Exercise Current Exercise Habits: The patient does not participate Anna regular exercise at present, Exercise limited by: orthopedic condition(s)  Diet Patient reports consuming  2 meals a day and 1 snack(s) a day Patient reports that her primary diet is: Regular Patient reports that she does have regular access to food.   Depression Screen    07/08/2022   10:41 AM 07/02/2022    3:05 PM 01/04/2022    1:25 PM 11/06/2021    1:57 PM 10/27/2021   12:14 PM 07/02/2021   11:07 AM 06/02/2020    2:52 PM  PHQ 2/9 Scores  PHQ - 2 Score 0 0 0 1  PHQ- 9 Score Fall Risk    07/08/2022   10:41 AM 07/02/2022    3:05 PM 01/04/2022    1:24 PM 11/06/2021    2:12 PM 11/06/2021    1:55 PM  Fall Risk   Falls Anna the past year? 0 0 Number falls Anna past yr: 0 0 1 0 0  Injury with Fall? 0 0 0 0   Risk for fall due to : Impaired mobility No Fall Risks History of fall(s)  Impaired balance/gait;History of fall(s);Impaired mobility  Follow up Falls evaluation completed Falls evaluation completed Falls prevention discussed  Education provided;Falls evaluation completed;Falls prevention discussed     Objective:  Emila Steinhauser seemed alert and oriented and she participated appropriately during our telephone visit.  Blood Pressure Weight BMI  BP Readings from Last 3 Encounters:  01/04/22 (!) 93/52  12/04/21 102/70  10/05/21 (!) 112/49   Wt Readings from Last 3 Encounters:  12/04/21 95 lb (43.1 kg)  03/23/21 95 lb (43.1 kg)  02/27/21 95 lb (43.1 kg)   BMI Readings from Last 1 Encounters:  11/09/13 17.95 kg/m    *Unable to obtain current vital signs, weight, and BMI due to telephone visit type  Hearing/Vision  Kathyann did not seem to have difficulty with hearing/understanding during the telephone conversation Reports that she has not had a formal eye exam by an eye care professional within the past year Reports that she has not had a formal hearing evaluation within the past year *Unable to fully assess hearing  and vision during telephone visit type  Cognitive Function:    07/08/2022   10:48 AM 07/02/2021   11:13 AM 10/26/2019    2:44 PM 06/29/2018     1:24 PM  6CIT Screen  What Year? 0 points 0 points 0 points 0 points  What month? 0 points 0 points 0 points 0 points  What time? 0 points 0 points 0 points 0 points  Count back from 20 0 points 0 points 2 points 0 points  Months Anna reverse 0 points 0 points 0 points 0 points  Repeat phrase 0 points 0 points 4 points 0 points  Total Score 0 points 0 points 6 points 0 points   (Normal:0-7, Significant for Dysfunction: >8)  Normal Cognitive Function Screening: Yes   Immunization & Bowen Maintenance Record Immunization History  Administered Date(s) Administered   Td 05/03/2009    Bowen Maintenance  Topic Date Due   INFLUENZA VACCINE  11/24/2022 (Originally 03/26/2022)   MAMMOGRAM  07/03/2023 (Originally 12/12/2018)   COLONOSCOPY (Pts 45-59yrs Insurance coverage will need to be confirmed)  07/09/2023 (Originally 01/19/2006)   TETANUS/TDAP  07/09/2023 (Originally 05/04/2019)   PAP SMEAR-Modifier  09/05/2023 (Originally 01/19/1982)   Zoster Vaccines- Shingrix (1 of 2) 10/03/2023 (Originally 01/20/1980)   Medicare Annual Wellness (AWV)  07/09/2023   Hepatitis C Screening  Completed   HIV Screening  Completed   Pneumococcal Vaccine 82-78 Years old  Aged Out   HPV VACCINES  Aged Out       Assessment  This is a routine wellness examination for [employer].  Bowen Maintenance: Due or Overdue There are no preventive care reminders to display for this patient.   Anna Aly does not need a referral for Community Assistance: Care Management:   yes Social Work:    no Prescription Assistance:  no Nutrition/Diabetes Education:  no   Plan:  Personalized Goals  Goals Addressed               This Visit's Progress     Patient Stated (pt-stated)        07/08/2022 AWV Goal: Improved Nutrition/Diet  Patient will verbalize understanding that diet plays an important role Anna overall Bowen and that a poor diet is a risk factor for many chronic medical conditions.  Over  the next year, patient will improve self management of their diet by incorporating more water. Patient will utilize available community resources to help with food acquisition if needed (ex: food pantries, [address], etc) Patient will work with nutrition specialist if a referral was made        Personalized Bowen Maintenance & Screening Recommendations  Influenza vaccine Td vaccine Screening mammography Screening Pap smear and pelvic exam  Colorectal cancer screening Shingrix vaccine  Lung Cancer Screening Recommended: no (Low Dose CT Chest recommended if Age 81-80 years, 30 pack-year currently smoking OR have quit w/Anna past 15 years) Hepatitis C Screening recommended: no HIV Screening recommended: no  Advanced Directives: Written information was not prepared per patient's request.  Referrals & Orders Orders Placed This Encounter  Procedures   AMB Referral to Community Care Coordinaton    Follow-up Plan Follow-up with Agapito Games, MD as planned Due to lack of transportation, patient is not able to complete the preventative screenings but would like to complete them. Referral placed for community care coordinator. Medicare wellness visit Anna one year.  AVS printed and mailed to the patient.   I have personally reviewed and noted the following Anna  the patient's chart:   Medical and social history Use of alcohol, tobacco or illicit drugs  Current medications and supplements Functional ability and status Nutritional status Physical activity Advanced directives List of other physicians Hospitalizations, surgeries, and ER visits Anna previous 12 months Vitals Screenings to include cognitive, depression, and falls Referrals and appointments  Anna addition, I have reviewed and discussed with Anna Aly certain preventive protocols, quality metrics, and best practice recommendations. A written personalized care plan for preventive services as well as general  preventive Bowen recommendations is available and can be mailed to the patient at her request.      Modesto Charon, RN BSN  07/08/2022

## 2022-07-10 NOTE — Telephone Encounter (Signed)
Information regarding upcoming appts given

## 2022-09-05 ENCOUNTER — Other Ambulatory Visit: Payer: Self-pay | Admitting: Family Medicine

## 2022-09-05 DIAGNOSIS — N390 Urinary tract infection, site not specified: Secondary | ICD-10-CM

## 2022-10-22 ENCOUNTER — Other Ambulatory Visit: Payer: Self-pay | Admitting: Family Medicine

## 2022-10-22 DIAGNOSIS — N3281 Overactive bladder: Secondary | ICD-10-CM

## 2022-10-22 DIAGNOSIS — K589 Irritable bowel syndrome without diarrhea: Secondary | ICD-10-CM

## 2022-12-05 ENCOUNTER — Other Ambulatory Visit: Payer: Self-pay | Admitting: Family Medicine

## 2022-12-05 DIAGNOSIS — I10 Essential (primary) hypertension: Secondary | ICD-10-CM

## 2023-01-01 ENCOUNTER — Telehealth (INDEPENDENT_AMBULATORY_CARE_PROVIDER_SITE_OTHER): Payer: Medicaid Other | Admitting: Family Medicine

## 2023-01-01 DIAGNOSIS — I10 Essential (primary) hypertension: Secondary | ICD-10-CM

## 2023-01-01 NOTE — Progress Notes (Signed)
Called pt and LVM informing her that Dr. Linford Arnold would be contacting her around lunch to do her visit. Will send link to email that is listed for her to sign on.

## 2023-01-22 ENCOUNTER — Other Ambulatory Visit: Payer: Self-pay | Admitting: Family Medicine

## 2023-01-22 DIAGNOSIS — N390 Urinary tract infection, site not specified: Secondary | ICD-10-CM

## 2023-03-05 ENCOUNTER — Other Ambulatory Visit: Payer: Self-pay | Admitting: Family Medicine

## 2023-03-05 DIAGNOSIS — N3281 Overactive bladder: Secondary | ICD-10-CM

## 2023-03-05 DIAGNOSIS — K589 Irritable bowel syndrome without diarrhea: Secondary | ICD-10-CM

## 2023-03-05 NOTE — Telephone Encounter (Signed)
Both these meds were sent for 90 days with 1 refill in February.  Will go ahead and send an updated prescription for August.

## 2023-03-05 NOTE — Telephone Encounter (Signed)
Patient called office to see if she could get a refill on dicyclomine, and her oxybutynin, please advise, thanks.

## 2023-03-12 ENCOUNTER — Telehealth: Payer: Self-pay | Admitting: Family Medicine

## 2023-03-12 DIAGNOSIS — G809 Cerebral palsy, unspecified: Secondary | ICD-10-CM

## 2023-03-12 NOTE — Telephone Encounter (Signed)
Rosanne Ashing [phone] called she is concerned about patient's health she hasn't been seen in quite sometime she  is physically unable to come into the office has no computer skills to go onto mychart she is asking how the patient can be seen by the doctor and could a request be put in for home health services please

## 2023-03-12 NOTE — Telephone Encounter (Signed)
Attempted call to brenda kelly- no answer no voice mail - could not leave a voice mail message.

## 2023-03-12 NOTE — Telephone Encounter (Signed)
I am going to go ahead and place a social management referral to see you if she might be a candidate to have a provider come and see her in her home since she can no longer come here.  As far as home health services she is actually supposed to be receiving a certain number of direct care hours during the week so I would need to know specifically what kind of home health services she is requesting.

## 2023-03-18 NOTE — Telephone Encounter (Signed)
Attempted call to patient . Phone rang without answer. Could not leave a voice mail message.

## 2023-04-17 NOTE — Telephone Encounter (Signed)
Hey, she talking about Home Health services?  If so,  Home Health is going to require a recent visit to at least document need so that insurance for pay for services. Also, can you please social work orders and I will send referral to Care Coordination for assistance.

## 2023-04-17 NOTE — Telephone Encounter (Signed)
Hi Anna Bowen see note for phone number below. Thank you for your help.

## 2023-04-22 ENCOUNTER — Other Ambulatory Visit: Payer: Self-pay | Admitting: Family Medicine

## 2023-04-22 DIAGNOSIS — I10 Essential (primary) hypertension: Secondary | ICD-10-CM

## 2023-04-29 ENCOUNTER — Telehealth: Payer: Self-pay

## 2023-04-29 NOTE — Telephone Encounter (Signed)
..   Medicaid Managed Care   Unsuccessful Outreach Note  04/29/2023 Name: Anna Bowen MRN: [id number] DOB: May 31, 1961  Referred by: Agapito Games, MD Reason for referral : Advice Only (I called the patient today to give her information on Providers that will make house visits. She did not answer but I did leave a message on her voicemail with my name and number. )   An unsuccessful telephone outreach was attempted today. The patient was referred to the case management team for assistance with care management and care coordination.   Follow Up Plan: A HIPAA compliant phone message was left for the patient providing contact information and requesting a return call.  The care management team will reach out to the patient again over the next 7 days.   Weston Settle Care Guide  [facility] Managed  Care Guide [facility]  [phone]

## 2023-05-07 ENCOUNTER — Ambulatory Visit
Admission: EM | Admit: 2023-05-07 | Discharge: 2023-05-07 | Disposition: A | Discharge status: Home / Self Care | Payer: Medicare Other | Attending: Family Medicine | Admitting: Family Medicine

## 2023-05-07 ENCOUNTER — Encounter: Payer: Self-pay | Admitting: Emergency Medicine

## 2023-05-07 ENCOUNTER — Ambulatory Visit (INDEPENDENT_AMBULATORY_CARE_PROVIDER_SITE_OTHER): Payer: Medicare Other

## 2023-05-07 DIAGNOSIS — M25561 Pain in right knee: Secondary | ICD-10-CM

## 2023-05-07 DIAGNOSIS — S8991XA Unspecified injury of right lower leg, initial encounter: Secondary | ICD-10-CM

## 2023-05-07 MED ORDER — TRAMADOL-ACETAMINOPHEN 37.5-325 MG PO TABS: 1.0000 | ORAL_TABLET | Freq: Four times a day (QID) | ORAL | 0 refills | Status: DC | PRN
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
## 2023-05-07 NOTE — Discharge Instructions (Addendum)
Advised patient/brother take medication as directed with food as needed for right knee pain.  Advised patient to take medication as directed with food.  Encouraged to increase daily water intake while taking this medication.  Advised we will follow-up with x-ray results before closing tonight, 05/07/2023.

## 2023-05-07 NOTE — ED Triage Notes (Signed)
Patient hx of cerebral palsy and chronically using wheelchair. Was trying to get to bathroom this morning when her right knee was flexed too far underneath her during the process. States this has happened to her in the past and was dx with a knee sprain after imaging. Requesting imaging today. Denies any new numbness/tingling/weakness in right leg below injury. No obvious acute deformity visible in triage. Has not taken any medicine to try to help with the pain.

## 2023-05-12 ENCOUNTER — Telehealth: Payer: Self-pay

## 2023-05-12 NOTE — Telephone Encounter (Signed)
..  Medicaid Managed Care   Unsuccessful Outreach Note  05/12/2023 Name: Anna Bowen MRN: [id number] DOB: 1961-05-19  Referred by: Agapito Games, MD Reason for referral : Appointment   A second unsuccessful telephone outreach was attempted today. The patient was referred to the case management team for assistance with care management and care coordination.   Follow Up Plan: A HIPAA compliant phone message was left for the patient providing contact information and requesting a return call.  The care management team will reach out to the patient again over the next 7 days.   Weston Settle Care Guide  [facility] Managed  Care Guide [facility]  [phone]

## 2023-05-21 ENCOUNTER — Telehealth: Payer: Self-pay

## 2023-05-21 NOTE — Telephone Encounter (Signed)
..  Medicaid Managed Care   Unsuccessful Outreach Note  05/21/2023 Name: Anna Bowen MRN: [id number] DOB: 05-10-1961  Referred by: Agapito Games, MD Reason for referral : Appointment   Third unsuccessful telephone outreach was attempted today. The patient was referred to the case management team for assistance with care management and care coordination. The patient's primary care provider has been notified of our unsuccessful attempts to make or maintain contact with the patient. The care management team is pleased to engage with this patient at any time in the future should he/she be interested in assistance from the care management team.   Follow Up Plan: We have been unable to make contact with the patient for follow up. The care management team is available to follow up with the patient after provider conversation with the patient regarding recommendation for care management engagement and subsequent re-referral to the care management team.   Weston Settle Care Guide  [facility] Managed  Care Guide [facility]  [phone]   [facility]

## 2023-05-23 ENCOUNTER — Ambulatory Visit (INDEPENDENT_AMBULATORY_CARE_PROVIDER_SITE_OTHER): Payer: Medicare Other | Admitting: Family Medicine

## 2023-05-23 ENCOUNTER — Encounter: Payer: Self-pay | Admitting: Family Medicine

## 2023-05-23 VITALS — BP 103/70 | HR 89

## 2023-05-23 DIAGNOSIS — M25561 Pain in right knee: Secondary | ICD-10-CM | POA: Diagnosis not present

## 2023-05-23 DIAGNOSIS — N3281 Overactive bladder: Secondary | ICD-10-CM

## 2023-05-23 DIAGNOSIS — R262 Difficulty in walking, not elsewhere classified: Secondary | ICD-10-CM

## 2023-05-23 DIAGNOSIS — I10 Essential (primary) hypertension: Secondary | ICD-10-CM

## 2023-05-23 MED ORDER — TRAMADOL-ACETAMINOPHEN 37.5-325 MG PO TABS: 1.0000 | ORAL_TABLET | Freq: Four times a day (QID) | ORAL | 0 refills | Status: DC | PRN

## 2023-05-23 NOTE — Assessment & Plan Note (Signed)
Well controlled. Continue current regimen. Follow up in  6 mo  

## 2023-05-23 NOTE — Assessment & Plan Note (Signed)
Also completed forms for adult diapers.  She says the ones that she has are little too small which is an adult small and would like to go up a size.  So asked them to fit her with adult medium.

## 2023-05-23 NOTE — Progress Notes (Signed)
Established Patient Office Visit  Subjective   Patient ID: Anna Bowen, female    DOB: December 14, 1960  Age: 62 y.o. MRN: [id number]  Chief Complaint  Patient presents with   Knee Pain    Rt knee pain    HPI  He is here today to follow-up for right knee pain.  She was seen in urgent care on 911 and had plain films done.  X-ray at that time showed depression of the medial tibial plateau which they felt was most likely either chronic degeneration or possible old injury.  She does not have full extension or flexion of that knee and has not for years.  When she was a child she had a few surgeries on her legs that were nonspecific but she also wore braces on her legs.  She says now it still continues to be painful medially and a little bit posteriorly and she feels like she cannot bear weight on that knee she is just afraid that it is going to get hurt again.  Evidently the knee over flexed and maybe even twisted according to her brother who is here with her today.    ROS    Objective:     BP 103/70   Pulse 89   SpO2 96%    Physical Exam Vitals reviewed.  Constitutional:      Appearance: Normal appearance.     Comments: Sitting in a wheelchair.  HENT:     Head: Normocephalic.  Pulmonary:     Effort: Pulmonary effort is normal.  Musculoskeletal:     Comments: She is unable to fully extend that right knee and is only able to flex a little bit past 90 degrees maybe 95 or 100 degrees.  She is a little bit tender media posteriorly.  Nontender over the patella.  No significant swelling on exam today.  Neurological:     Mental Status: She is alert and oriented to person, place, and time.  Psychiatric:        Mood and Affect: Mood normal.        Behavior: Behavior normal.      No results found for any visits on 05/23/23.    The 10-year ASCVD risk score (Arnett DK, et al., 2019) is: 3%    Assessment & Plan:   Problem List Items Addressed This Visit        Cardiovascular and Mediastinum   Essential hypertension, benign    Well controlled. Continue current regimen. Follow up in  24mo         Genitourinary   OAB (overactive bladder)    Also completed forms for adult diapers.  She says the ones that she has are little too small which is an adult small and would like to go up a size.  So asked them to fit her with adult medium.        Other   Inability to walk   Relevant Orders   MR Knee Right Wo Contrast   Other Visit Diagnoses     Acute pain of right knee    -  Primary   Relevant Medications   traMADol-acetaminophen (ULTRACET) 37.5-325 MG tablet   Other Relevant Orders   MR Knee Right Wo Contrast      Right knee pain-we did discuss adding some support either with an Ace wrap or a compression sleeve we fitted her with a compression sleeve today just for support and comfort.  She can ice the knee as needed.  Will go ahead and refill the tramadol that she was given as she did say it was helpful.  We also discussed moving forward with additional imaging since she continues to have difficulty bearing weight again at her baseline she does not have full range of motion of that joint.  .  Return if symptoms worsen or fail to improve.    Anna Gasser, MD

## 2023-05-31 ENCOUNTER — Ambulatory Visit: Payer: Medicare Other

## 2023-05-31 DIAGNOSIS — R262 Difficulty in walking, not elsewhere classified: Secondary | ICD-10-CM

## 2023-05-31 DIAGNOSIS — M25561 Pain in right knee: Secondary | ICD-10-CM

## 2023-06-16 NOTE — Progress Notes (Signed)
Hi Anna Bowen, MRI of your knee shows a lot of loss of cartilage so pretty severe arthritis.  I would like to get you in with Dr. Karie Schwalbe our sports medicine doctor to discuss options to help support your knee.  You also  have some instability of the kneecap.

## 2023-07-04 ENCOUNTER — Telehealth: Payer: Self-pay | Admitting: Family Medicine

## 2023-07-04 NOTE — Telephone Encounter (Signed)
Needs to call the pharmacy. They have a refill on file

## 2023-07-04 NOTE — Telephone Encounter (Signed)
Patient called she is requesting refills for Lisinopril 10mg  [facility] [address] [place] [phone]

## 2023-07-08 NOTE — Telephone Encounter (Signed)
Left message advising patient the prescription is at the pharmacy.

## 2023-07-11 ENCOUNTER — Encounter: Payer: Self-pay | Admitting: Sports Medicine

## 2023-07-11 ENCOUNTER — Ambulatory Visit (INDEPENDENT_AMBULATORY_CARE_PROVIDER_SITE_OTHER): Payer: Medicare Other | Admitting: Sports Medicine

## 2023-07-11 ENCOUNTER — Encounter: Payer: Medicare Other | Admitting: Sports Medicine

## 2023-07-11 DIAGNOSIS — G809 Cerebral palsy, unspecified: Secondary | ICD-10-CM | POA: Diagnosis not present

## 2023-07-11 DIAGNOSIS — M1711 Unilateral primary osteoarthritis, right knee: Secondary | ICD-10-CM | POA: Diagnosis not present

## 2023-07-11 MED ORDER — MELOXICAM 15 MG PO TABS: ORAL_TABLET | ORAL | 3 refills | Status: DC

## 2023-07-11 NOTE — Progress Notes (Signed)
    Procedures performed today:    None.  Independent interpretation of notes and tests performed by another provider:   None.  Brief History, Exam, Impression, and Recommendations:    Primary osteoarthritis of right knee This is a very pleasant 62 year old female, she has history of cerebral palsy and is unable to walk, she does have some contractures. She has had increasing pain right knee anteriorly. Was seen in urgent care, ultimately x-rays and an MRI showed osteoarthritis without evidence of meniscal tearing. She was started on Ultracet. We did discuss additional modalities to treat her that are nonnarcotic, we will switch from ibuprofen to meloxicam, hold on Ultracet except for breakthrough pain. Adding home health physical therapy. If insufficient improvement after 6 weeks we will inject her joint. See below, I would like her to work with physiatry to come up with the rehabilitation plan for her cerebral palsy, she was walking in the past and has become progressively debilitated.  Cerebral palsy (HCC) Spastic quadriplegic cerebral palsy, she historically was able to walk, but has become progressively debilitated. We are treating her knee osteoarthritis but I would like a consultation with physiatry to help come up with a treatment plan to try to get her walking again.    ____________________________________________ Ihor Austin. Benjamin Stain, M.D., ABFM., CAQSM., AME. Primary Care and Sports Medicine [facility]  Adjunct Professor of Family Medicine  [employer]  Restaurant manager, fast food

## 2023-07-11 NOTE — Assessment & Plan Note (Signed)
Spastic quadriplegic cerebral palsy, she historically was able to walk, but has become progressively debilitated. We are treating her knee osteoarthritis but I would like a consultation with physiatry to help come up with a treatment plan to try to get her walking again.

## 2023-07-11 NOTE — Assessment & Plan Note (Signed)
This is a very pleasant 62 year old female, she has history of cerebral palsy and is unable to walk, she does have some contractures. She has had increasing pain right knee anteriorly. Was seen in urgent care, ultimately x-rays and an MRI showed osteoarthritis without evidence of meniscal tearing. She was started on Ultracet. We did discuss additional modalities to treat her that are nonnarcotic, we will switch from ibuprofen to meloxicam, hold on Ultracet except for breakthrough pain. Adding home health physical therapy. If insufficient improvement after 6 weeks we will inject her joint. See below, I would like her to work with physiatry to come up with the rehabilitation plan for her cerebral palsy, she was walking in the past and has become progressively debilitated.

## 2023-07-14 ENCOUNTER — Ambulatory Visit (INDEPENDENT_AMBULATORY_CARE_PROVIDER_SITE_OTHER): Payer: Medicare Other | Admitting: Family Medicine

## 2023-07-14 ENCOUNTER — Encounter: Payer: Self-pay | Admitting: Family Medicine

## 2023-07-14 VITALS — Ht 60.0 in | Wt 95.0 lb

## 2023-07-14 DIAGNOSIS — G809 Cerebral palsy, unspecified: Secondary | ICD-10-CM | POA: Diagnosis not present

## 2023-07-14 DIAGNOSIS — Z0000 Encounter for general adult medical examination without abnormal findings: Secondary | ICD-10-CM

## 2023-07-14 NOTE — Progress Notes (Signed)
Subjective:   Anna Bowen is a 62 y.o. female who presents for Medicare Annual (Subsequent) preventive examination.  Visit Complete: Virtual I connected with  Alanson Aly on 07/14/23 by a audio enabled telemedicine application and verified that I am speaking with the correct person using two identifiers.  Patient Location: Home  Provider Location: Office/Clinic  I discussed the limitations of evaluation and management by telemedicine. The patient expressed understanding and agreed to proceed.  Vital Signs: Because this visit was a virtual/telehealth visit, some criteria may be missing or patient reported. Any vitals not documented were not able to be obtained and vitals that have been documented are patient reported.  Patient Medicare AWV questionnaire was completed by the patient on 07/14/23; I have confirmed that all information answered by patient is correct and no changes since this date.  Cardiac Risk Factors include: sedentary lifestyle;Other (see comment), Risk factor comments: frail     Objective:    Today's Vitals   07/14/23 1020  Weight: 95 lb (43.1 kg)  Height: 5' (1.524 m)   Body mass index is 18.55 kg/m.     07/14/2023   10:40 AM 07/08/2022   10:42 AM 11/06/2021    1:59 PM 10/27/2021   12:20 PM 10/26/2019    2:39 PM 06/29/2018    1:12 PM  Advanced Directives  Does Patient Have a Medical Advance Directive? No No Yes Yes No No  Type of Best boy of [place];Living will Healthcare Power of [place];Living will    Does patient want to make changes to medical advance directive?    No - Patient declined    Copy of Healthcare Power of Attorney in Chart?   No - copy requested No - copy requested    Would patient like information on creating a medical advance directive? No - Patient declined No - Patient declined   No - Patient declined Yes (MAU/Ambulatory/Procedural Areas - Information given)    Current Medications  (verified) Outpatient Encounter Medications as of 07/14/2023  Medication Sig   AMBULATORY NON FORMULARY MEDICATION Medication Name: Electric wheelchair with controls on the left, seatbelt, gel cushion, etc   AMBULATORY NON FORMULARY MEDICATION Medication Name: disposable underpads 36x36  medium absorbency   AMBULATORY NON FORMULARY MEDICATION Medication Name: Adult/youth small underwear   AMBULATORY NON FORMULARY MEDICATION Needs personal care services.  Patient is wheelchair-bound secondary to cerebral palsy.  Please fax to [facility]  Currently qualifies for 18.5 hours per week.   dicyclomine (BENTYL) 20 MG tablet TAKE 1 TABLET BY MOUTH TWICE A DAY AS NEEDED   lisinopril (ZESTRIL) 10 MG tablet TAKE 1 TABLET BY MOUTH EVERY DAY   meloxicam (MOBIC) 15 MG tablet One tab PO every 24 hours with a meal for 2 weeks, then once every 24 hours prn pain.   nitrofurantoin (MACRODANTIN) 100 MG capsule TAKE 1 CAPSULE BY MOUTH EVERY DAY   oxybutynin (DITROPAN) 5 MG tablet TAKE 1 TABLET BY MOUTH THREE TIMES A DAY   nystatin cream (MYCOSTATIN) Apply 1 application. topically 2 (two) times daily. X 10 days (Patient not taking: Reported on 07/14/2023)   pantoprazole (PROTONIX) 40 MG tablet Take 1 tablet (40 mg total) by mouth daily. (Patient not taking: Reported on 07/14/2023)   traMADol-acetaminophen (ULTRACET) 37.5-325 MG tablet Take 1 tablet by mouth every 6 (six) hours as needed. (Patient not taking: Reported on 07/14/2023)   No facility-administered encounter medications on file as of 07/14/2023.    Allergies (verified)  Penicillins   History: Past Medical History:  Diagnosis Date   Cerebral palsy (HCC)    since birth   GERD (gastroesophageal reflux disease)    Hypertension    IBS (irritable bowel syndrome)    Thyroid disease    Past Surgical History:  Procedure Laterality Date   LEG SURGERY Bilateral    Family History  Problem Relation Age of Onset   Osteoporosis Mother    Lung  disease Mother        Restrictive\   Hypertension Mother    Cancer Father        brain tumor died age 31   Diabetes Brother    Social History   Socioeconomic History   Marital status: Single    Spouse name: Not on file   Number of children: 0   Years of education: 12   Highest education level: 12th grade  Occupational History   Occupation: disability    Comment: [employer]  Tobacco Use   Smoking status: Never    Passive exposure: Current   Smokeless tobacco: Never  Vaping Use   Vaping status: Never Used  Substance and Sexual Activity   Alcohol use: No   Drug use: No   Sexual activity: Not Currently  Other Topics Concern   Not on file  Social History Narrative   Lives with her brother. She enjoys knitting and Does word searches at home to help stimulate the brain.    Social Determinants of Health   Financial Resource Strain: Low Risk  (07/14/2023)   Overall Financial Resource Strain (CARDIA)    Difficulty of Paying Living Expenses: Not hard at all  Food Insecurity: No Food Insecurity (07/14/2023)   Hunger Vital Sign    Worried About Running Out of Food in the Last Year: Never true    Ran Out of Food in the Last Year: Never true  Transportation Needs: Unmet Transportation Needs (07/14/2023)   PRAPARE - Administrator, Civil Service (Medical): Yes    Lack of Transportation (Non-Medical): Yes  Physical Activity: Inactive (07/14/2023)   Exercise Vital Sign    Days of Exercise per Week: 0 days    Minutes of Exercise per Session: 0 min  Stress: No Stress Concern Present (07/14/2023)   [employer] of Occupational Health - Occupational Stress Questionnaire    Feeling of Stress : Only a little  Social Connections: Socially Isolated (07/14/2023)   Social Connection and Isolation Panel [NHANES]    Frequency of Communication with Friends and Family: Once a week    Frequency of Social Gatherings with Friends and Family: Twice a week    Attends Religious  Services: Never    Database administrator or Organizations: No    Attends Engineer, structural: Never    Marital Status: Never married    Tobacco Counseling Counseling given: Not Answered   Clinical Intake:  Pre-visit preparation completed: No  Pain : No/denies pain     BMI - recorded: 18.5 Nutritional Status: BMI <19  Underweight Nutritional Risks: Unintentional weight loss Diabetes: No  How often do you need to have someone help you when you read instructions, pamphlets, or other written materials from your doctor or pharmacy?: 1 - Never What is the last grade level you completed in school?: 12  Interpreter Needed?: No      Activities of Daily Living    07/14/2023   10:26 AM  In your present state of health, do you have any difficulty  performing the following activities:  Hearing? 0  Vision? 0  Difficulty concentrating or making decisions? 0  Walking or climbing stairs? 1  Dressing or bathing? 1  Doing errands, shopping? 1  Comment brother helps with this  Preparing Food and eating ? Y  Comment needs assistance  Using the Toilet? Y  Comment needs assistance  In the past six months, have you accidently leaked urine? N  Do you have problems with loss of bowel control? N  Managing your Medications? Y  Comment needs assistance  Managing your Finances? Y  Housekeeping or managing your Housekeeping? Y    Patient Care Team: Agapito Games, MD as PCP - General  Indicate any recent Medical Services you may have received from other than [facility] providers in the past year (date may be approximate).     Assessment:   This is a routine wellness examination for [facility].  Hearing/Vision screen Hearing Screening - Comments:: Grossly intact, unable to test Vision Screening - Comments:: Unable to test. Reports blurriness. Declines eye doctor referral    Goals Addressed             This Visit's Progress    Mobility and Independence Optimized        Evidence-based guidance:  Refer to physical therapy or occupational therapy for assessment and individualized program.  Provide therapy that may include functional task training, balance, active or passive exercise, spasticity management, assistive device training, cardiorespiratory fitness, Pilates and telerehabilitation services.  Identify barriers to participation in therapy or exercise such as pain with activity, anticipated or imagined pain, transportation, depression or fear.  Work with patient and family to develop self-management plan to remove barriers.  Refer to speech/language pathologist to assess and treat speech/language deficit and swallowing impairment.  Periodically review ability to perform activities of daily living and required amount of assistance needed for safety, optimal independence and self-care.  Encourage appropriate vocational or educational counseling for re-entering the community, workplace or school; consider a driving evaluation.  Assist patient to advocate for necessary adaptations to the work or school environment.  Refer to employer for information about FMLA (Family and Medical Leave Act), Short or Long-Term Disability and options for adjustments to work schedule, assignment or hours.   Notes: maintain independence as much as possible.       Depression Screen    07/14/2023   10:45 AM 07/14/2023   10:38 AM 07/08/2022   10:41 AM 07/02/2022    3:05 PM 01/04/2022    1:25 PM 11/06/2021    1:57 PM 10/27/2021   12:14 PM  PHQ 2/9 Scores  PHQ - 2 Score 0 1 1 1 2  0 0  PHQ- 9 Score   1 3 6       Fall Risk    07/14/2023   10:42 AM 07/08/2022   10:41 AM 07/02/2022    3:05 PM 01/04/2022    1:24 PM 11/06/2021    2:12 PM  Fall Risk   Falls in the past year? 0 0 0 1 1  Number falls in past yr: 0 0 0 1 0  Injury with Fall? 0 0 0 0 0  Comment in wheelchair      Risk for fall due to : Impaired mobility;Impaired vision;Other (Comment) Impaired mobility No Fall Risks  History of fall(s)   Follow up  Falls evaluation completed Falls evaluation completed Falls prevention discussed     MEDICARE RISK AT HOME: Medicare Risk at Home Any stairs in or around the  home?: No If so, are there any without handrails?: No Home free of loose throw rugs in walkways, pet beds, electrical cords, etc?: Yes Adequate lighting in your home to reduce risk of falls?: Yes Life alert?: No ("they want me to, but I don't have any where to put it") Use of a cane, walker or w/c?: Yes Grab bars in the bathroom?: Yes Shower chair or bench in shower?: No Elevated toilet seat or a handicapped toilet?: No  TIMED UP AND GO:  Was the test performed?  No    Cognitive Function:        07/14/2023   10:45 AM 07/08/2022   10:48 AM 07/02/2021   11:13 AM 10/26/2019    2:44 PM 06/29/2018    1:24 PM  6CIT Screen  What Year? 0 points 0 points 0 points 0 points 0 points  What month? 0 points 0 points 0 points 0 points 0 points  What time? 0 points 0 points 0 points 0 points 0 points  Count back from 20 0 points 0 points 0 points 2 points 0 points  Months in reverse 0 points 0 points 0 points 0 points 0 points  Repeat phrase 2 points 0 points 0 points 4 points 0 points  Total Score 2 points 0 points 0 points 6 points 0 points    Immunizations Immunization History  Administered Date(s) Administered   Td 05/03/2009    TDAP status: Due, Education has been provided regarding the importance of this vaccine. Advised may receive this vaccine at local pharmacy or Health Dept. Aware to provide a copy of the vaccination record if obtained from local pharmacy or Health Dept. Verbalized acceptance and understanding.  Flu Vaccine status: Declined, Education has been provided regarding the importance of this vaccine but patient still declined. Advised may receive this vaccine at local pharmacy or Health Dept. Aware to provide a copy of the vaccination record if obtained from local pharmacy or Health  Dept. Verbalized acceptance and understanding.  Pneumococcal vaccine status: Declined,  Education has been provided regarding the importance of this vaccine but patient still declined. Advised may receive this vaccine at local pharmacy or Health Dept. Aware to provide a copy of the vaccination record if obtained from local pharmacy or Health Dept. Verbalized acceptance and understanding.   Covid-19 vaccine status: Declined, Education has been provided regarding the importance of this vaccine but patient still declined. Advised may receive this vaccine at local pharmacy or Health Dept.or vaccine clinic. Aware to provide a copy of the vaccination record if obtained from local pharmacy or Health Dept. Verbalized acceptance and understanding.  Qualifies for Shingles Vaccine? Yes   Zostavax completed No   Shingrix Completed?: No.    Education has been provided regarding the importance of this vaccine. Patient has been advised to call insurance company to determine out of pocket expense if they have not yet received this vaccine. Advised may also receive vaccine at local pharmacy or Health Dept. Verbalized acceptance and understanding.  Screening Tests Health Maintenance  Topic Date Due   COVID-19 Vaccine (1) Never done   Colonoscopy  Never done   MAMMOGRAM  12/12/2018   Zoster Vaccines- Shingrix (1 of 2) 10/03/2023 (Originally [birth year]/20/1980)   INFLUENZA VACCINE  11/24/2023 (Originally 03/27/2023)   DTaP/Tdap/Td (2 - Tdap) 05/18/2024 (Originally 05/04/2019)   Cervical Cancer Screening (HPV/Pap Cotest)  05/22/2024 (Originally 01/20/1991)   Medicare Annual Wellness (AWV)  07/13/2024   Hepatitis C Screening  Completed   HIV Screening  Completed   Pneumococcal Vaccine 70-87 Years old  Aged Out   HPV VACCINES  Aged Out    Health Maintenance  Health Maintenance Due  Topic Date Due   COVID-19 Vaccine (1) Never done   Colonoscopy  Never done   MAMMOGRAM  12/12/2018    Colorectal cancer screening: Type  of screening: Colonoscopy. Completed declines . Repeat every declines  years  Mammogram status: Ordered 2015. Pt provided with contact info and advised to call to schedule appt. Declines: does not want to get mammograms   Declines bone density.   Lung Cancer Screening: (Low Dose CT Chest recommended if Age 22-80 years, 20 pack-year currently smoking OR have quit w/in 15years.) does not qualify.   Lung Cancer Screening Referral: n/a never smoker   Additional Screening:  Hepatitis C Screening: does qualify; Completed 11/09/20  Vision Screening: Recommended annual ophthalmology exams for early detection of glaucoma and other disorders of the eye. Is the patient up to date with their annual eye exam?  Has not had eye exam in a "long time" does not want to go back to eye doctor.  Who is the provider or what is the name of the office in which the patient attends annual eye exams? Declines referral "I just don't want to go back to eye doctor"  If pt is not established with a provider, would they like to be referred to a provider to establish care? No .   Dental Screening: Recommended annual dental exams for proper oral hygiene  Diabetic Foot Exam: not diabetic   Community Resource Referral / Chronic Care Management: CRR required this visit?   has Child psychotherapist in place   CCM required this visit?  No     Plan:     I have personally reviewed and noted the following in the patient's chart:   Medical and social history Use of alcohol, tobacco or illicit drugs  Current medications and supplements including opioid prescriptions. Patient is currently taking opioid prescriptions. Information provided to patient regarding non-opioid alternatives. Patient advised to discuss non-opioid treatment plan with their provider. Functional ability and status: wheelchair bound with home health aid Nutritional status Physical activity Advanced directives List of other physicians: none   Hospitalizations, surgeries, and ER visits in previous 12 months: has been to urgent care concerning knee.  Vitals Screenings to include cognitive, depression, and falls.  Referrals and appointments: declines referrals today.   In addition, I have reviewed and discussed with patient certain preventive protocols, quality metrics, and best practice recommendations. A written personalized care plan for preventive services as well as general preventive health recommendations were provided to patient.     Novella Olive, FNP   07/14/2023   After Visit Summary: (MyChart) Due to this being a telephonic visit, the after visit summary with patients personalized plan was offered to patient via MyChart

## 2023-07-16 DIAGNOSIS — Z87310 Personal history of (healed) osteoporosis fracture: Secondary | ICD-10-CM | POA: Diagnosis not present

## 2023-07-16 DIAGNOSIS — I10 Essential (primary) hypertension: Secondary | ICD-10-CM | POA: Diagnosis not present

## 2023-07-16 DIAGNOSIS — M810 Age-related osteoporosis without current pathological fracture: Secondary | ICD-10-CM | POA: Diagnosis not present

## 2023-07-16 DIAGNOSIS — G800 Spastic quadriplegic cerebral palsy: Secondary | ICD-10-CM | POA: Diagnosis not present

## 2023-07-16 DIAGNOSIS — M1711 Unilateral primary osteoarthritis, right knee: Secondary | ICD-10-CM | POA: Diagnosis not present

## 2023-07-18 ENCOUNTER — Telehealth: Payer: Self-pay

## 2023-07-18 NOTE — Telephone Encounter (Signed)
Left message advising ok for PT.

## 2023-07-18 NOTE — Telephone Encounter (Signed)
Copied from CRM [id number]. Topic: Clinical - Home Health Verbal Orders >> Jul 18, 2023  2:55 PM Gaetano Hawthorne wrote: Caller/Agency: Santiago Bumpers - [facility]  Callback Number: [phone] - Molli Knock to leave a message. Service Requested: Physical Therapy Frequency: Once a week for 6 weeks   Any new concerns about the patient? No

## 2023-07-20 ENCOUNTER — Other Ambulatory Visit: Payer: Self-pay | Admitting: Family Medicine

## 2023-07-20 DIAGNOSIS — N390 Urinary tract infection, site not specified: Secondary | ICD-10-CM

## 2023-07-22 DIAGNOSIS — M810 Age-related osteoporosis without current pathological fracture: Secondary | ICD-10-CM | POA: Diagnosis not present

## 2023-07-22 DIAGNOSIS — I10 Essential (primary) hypertension: Secondary | ICD-10-CM | POA: Diagnosis not present

## 2023-07-22 DIAGNOSIS — Z87310 Personal history of (healed) osteoporosis fracture: Secondary | ICD-10-CM | POA: Diagnosis not present

## 2023-07-22 DIAGNOSIS — M1711 Unilateral primary osteoarthritis, right knee: Secondary | ICD-10-CM | POA: Diagnosis not present

## 2023-07-22 DIAGNOSIS — G800 Spastic quadriplegic cerebral palsy: Secondary | ICD-10-CM | POA: Diagnosis not present

## 2023-07-30 DIAGNOSIS — Z87310 Personal history of (healed) osteoporosis fracture: Secondary | ICD-10-CM | POA: Diagnosis not present

## 2023-07-30 DIAGNOSIS — G800 Spastic quadriplegic cerebral palsy: Secondary | ICD-10-CM | POA: Diagnosis not present

## 2023-07-30 DIAGNOSIS — M1711 Unilateral primary osteoarthritis, right knee: Secondary | ICD-10-CM | POA: Diagnosis not present

## 2023-07-30 DIAGNOSIS — M810 Age-related osteoporosis without current pathological fracture: Secondary | ICD-10-CM | POA: Diagnosis not present

## 2023-07-30 DIAGNOSIS — I10 Essential (primary) hypertension: Secondary | ICD-10-CM | POA: Diagnosis not present

## 2023-08-05 DIAGNOSIS — G809 Cerebral palsy, unspecified: Secondary | ICD-10-CM | POA: Diagnosis not present

## 2023-08-05 DIAGNOSIS — R262 Difficulty in walking, not elsewhere classified: Secondary | ICD-10-CM | POA: Diagnosis not present

## 2023-08-05 DIAGNOSIS — M8000XA Age-related osteoporosis with current pathological fracture, unspecified site, initial encounter for fracture: Secondary | ICD-10-CM | POA: Diagnosis not present

## 2023-08-06 DIAGNOSIS — Z87310 Personal history of (healed) osteoporosis fracture: Secondary | ICD-10-CM | POA: Diagnosis not present

## 2023-08-06 DIAGNOSIS — G800 Spastic quadriplegic cerebral palsy: Secondary | ICD-10-CM | POA: Diagnosis not present

## 2023-08-06 DIAGNOSIS — M810 Age-related osteoporosis without current pathological fracture: Secondary | ICD-10-CM | POA: Diagnosis not present

## 2023-08-06 DIAGNOSIS — I10 Essential (primary) hypertension: Secondary | ICD-10-CM | POA: Diagnosis not present

## 2023-08-06 DIAGNOSIS — M1711 Unilateral primary osteoarthritis, right knee: Secondary | ICD-10-CM | POA: Diagnosis not present

## 2023-08-12 DIAGNOSIS — M810 Age-related osteoporosis without current pathological fracture: Secondary | ICD-10-CM | POA: Diagnosis not present

## 2023-08-12 DIAGNOSIS — Z87310 Personal history of (healed) osteoporosis fracture: Secondary | ICD-10-CM | POA: Diagnosis not present

## 2023-08-12 DIAGNOSIS — M1711 Unilateral primary osteoarthritis, right knee: Secondary | ICD-10-CM | POA: Diagnosis not present

## 2023-08-12 DIAGNOSIS — G800 Spastic quadriplegic cerebral palsy: Secondary | ICD-10-CM | POA: Diagnosis not present

## 2023-08-12 DIAGNOSIS — I10 Essential (primary) hypertension: Secondary | ICD-10-CM | POA: Diagnosis not present

## 2023-08-19 DIAGNOSIS — I10 Essential (primary) hypertension: Secondary | ICD-10-CM | POA: Diagnosis not present

## 2023-08-19 DIAGNOSIS — M810 Age-related osteoporosis without current pathological fracture: Secondary | ICD-10-CM | POA: Diagnosis not present

## 2023-08-19 DIAGNOSIS — G800 Spastic quadriplegic cerebral palsy: Secondary | ICD-10-CM | POA: Diagnosis not present

## 2023-08-19 DIAGNOSIS — Z87310 Personal history of (healed) osteoporosis fracture: Secondary | ICD-10-CM | POA: Diagnosis not present

## 2023-08-19 DIAGNOSIS — M1711 Unilateral primary osteoarthritis, right knee: Secondary | ICD-10-CM | POA: Diagnosis not present

## 2023-08-28 ENCOUNTER — Other Ambulatory Visit: Payer: Self-pay | Admitting: Family Medicine

## 2023-08-28 DIAGNOSIS — N3281 Overactive bladder: Secondary | ICD-10-CM

## 2023-08-28 DIAGNOSIS — K589 Irritable bowel syndrome without diarrhea: Secondary | ICD-10-CM

## 2023-08-29 ENCOUNTER — Ambulatory Visit (INDEPENDENT_AMBULATORY_CARE_PROVIDER_SITE_OTHER): Payer: Medicare Other | Admitting: Sports Medicine

## 2023-08-29 DIAGNOSIS — M1711 Unilateral primary osteoarthritis, right knee: Secondary | ICD-10-CM | POA: Diagnosis not present

## 2023-08-29 MED ORDER — MELOXICAM 15 MG PO TABS: ORAL_TABLET | ORAL | 3 refills | Status: DC

## 2023-08-29 NOTE — Assessment & Plan Note (Signed)
 Only mild to moderate improvements, she is still taking ibuprofen and tells me she is not taking meloxicam , we will call this in again. She also tells me that Ultracet  is giving her an upset stomach. We will resend the meloxicam , she will continue working with home health PT, I did a referral to physiatry but I do not think this has happened yet. Follow-up in 3 weeks, injection if not better.

## 2023-08-29 NOTE — Progress Notes (Signed)
    Procedures performed today:    None.  Independent interpretation of notes and tests performed by another provider:   None.  Brief History, Exam, Impression, and Recommendations:    Primary osteoarthritis of right knee Only mild to moderate improvements, she is still taking ibuprofen and tells me she is not taking meloxicam , we will call this in again. She also tells me that Ultracet  is giving her an upset stomach. We will resend the meloxicam , she will continue working with home health PT, I did a referral to physiatry but I do not think this has happened yet. Follow-up in 3 weeks, injection if not better.    ____________________________________________ Debby PARAS. Curtis, M.D., ABFM., CAQSM., AME. Primary Care and Sports Medicine [facility]  Adjunct Professor of [facility] Medicine  [employer]  Restaurant Manager, [employer]

## 2023-09-12 ENCOUNTER — Ambulatory Visit: Payer: Medicare Other | Admitting: Sports Medicine

## 2023-10-16 ENCOUNTER — Other Ambulatory Visit: Payer: Self-pay | Admitting: Family Medicine

## 2023-10-16 DIAGNOSIS — I10 Essential (primary) hypertension: Secondary | ICD-10-CM

## 2023-10-16 DIAGNOSIS — M25561 Pain in right knee: Secondary | ICD-10-CM

## 2023-12-10 NOTE — Progress Notes (Signed)
 [facility] Quality Team Note  Name: Brielle Moro Date of Birth: 1961-05-31 MRN: [id number] Date: 12/10/2023  [facility] Quality Team has reviewed this patient's chart, please see recommendations below:  [facility] Quality Other; (Patient due for breast cancer and colon cancer screenings. Patient needs appt with PCP to discuss)

## 2023-12-16 ENCOUNTER — Telehealth: Payer: Self-pay

## 2023-12-16 NOTE — Telephone Encounter (Signed)
 Copied from CRM [id number]. Topic: Medical Record Request - Records Request >> Dec 16, 2023  4:27 PM Jayson Michael wrote: Reason for CRM:  Tevin Horton, social worker with [facility] [employer], is following up on a pending medical records request. He is requesting an update on the patient's last documented cognitive status or capacity while awaiting full records. Callback: [phone].

## 2023-12-17 NOTE — Telephone Encounter (Signed)
 I called Mr. Anna Bowen and let him know that the medical records have been ordered he would like the provider to call and give cognitive status and capacity  [phone]

## 2023-12-23 ENCOUNTER — Telehealth: Payer: Self-pay

## 2023-12-23 NOTE — Telephone Encounter (Signed)
 Tonya, did you attempt to contact this patient?

## 2023-12-23 NOTE — Progress Notes (Signed)
 Called patient left vm to call back back and schedule an appt for [facility]

## 2023-12-23 NOTE — Telephone Encounter (Signed)
 Copied from CRM [id number]. Topic: General - Other >> Dec 23, 2023  1:54 PM Blair Bumpers wrote: Reason for CRM: Patient called stating that she thinks she missed a call from someone at the clinic. Checked chart for notes and there was none. Please contact patient, CB #: [phone].

## 2024-01-07 NOTE — Telephone Encounter (Signed)
 No contact made by me for this pt.

## 2024-01-30 ENCOUNTER — Telehealth: Payer: Self-pay

## 2024-01-30 DIAGNOSIS — N3281 Overactive bladder: Secondary | ICD-10-CM

## 2024-01-30 MED ORDER — AMBULATORY NON FORMULARY MEDICATION: 99 refills | Status: AC

## 2024-01-30 NOTE — Telephone Encounter (Signed)
 Request forwarded to [facility] covering Dr. Greer Leak. And Cole Daubs

## 2024-01-30 NOTE — Telephone Encounter (Signed)
 Lvm advising pt that I had sent prescription in for her briefs to [facility].

## 2024-01-30 NOTE — Telephone Encounter (Signed)
 Copied from CRM [id number]. Topic: Clinical - Medical Advice >> Jan 30, 2024 10:38 AM Adrianna P wrote: Reason for CRM: Patient called she needs a prescription for her briefs. She can be contacted at [phone]

## 2024-03-11 ENCOUNTER — Other Ambulatory Visit: Payer: Self-pay | Admitting: Family Medicine

## 2024-03-11 DIAGNOSIS — N390 Urinary tract infection, site not specified: Secondary | ICD-10-CM

## 2024-03-24 ENCOUNTER — Ambulatory Visit: Payer: Self-pay

## 2024-03-24 NOTE — Telephone Encounter (Signed)
 FYI Only or Action Required?: FYI only for provider.  Patient was last seen in primary care on 08/29/2023 by Curtis Debby PARAS, MD.  Called Nurse Triage reporting Leg Swelling.  Symptoms began several weeks ago.  Interventions attempted: Nothing.  Symptoms are: unchanged.  Triage Disposition: See PCP When Office is Open (Within 3 Days)  Patient/caregiver understands and will follow disposition?: Yes     Copied from CRM [id number]. Topic: Clinical - Red Word Triage >> Mar 24, 2024 12:04 PM Brittney F wrote: Red Word that prompted transfer to Nurse Triage:   Legs and feet are swelling  Swelling has been going on for the past few weeks  Jori Rakers: [phone] Reason for Disposition  [1] MILD swelling of both ankles (i.e., pedal edema) AND [2] new-onset or getting worse  Answer Assessment - Initial Assessment Questions 1. ONSET: When did the swelling start? (e.g., minutes, hours, days)     A few weeks ago 2. LOCATION: What part of the leg is swollen?  Are both legs swollen or just one leg?     Right foot and leg 3. SEVERITY: How bad is the swelling? (e.g., localized; mild, moderate, severe)     mild 4. REDNESS: Is there redness or signs of infection?     no 5. PAIN: Is the swelling painful to touch? If Yes, ask: How painful is it?   (Scale 1-10; mild, moderate or severe)     denies 6. FEVER: Do you have a fever? If Yes, ask: What is it, how was it measured, and when did it start?      denies 7. CAUSE: What do you think is causing the leg swelling?     unknown 8. MEDICAL HISTORY: Do you have a history of blood clots (e.g., DVT), cancer, heart failure, kidney disease, or liver failure?     denies 9. RECURRENT SYMPTOM: Have you had leg swelling before? If Yes, ask: When was the last time? What happened that time?     denies 10. OTHER SYMPTOMS: Do you have any other symptoms? (e.g., chest pain, difficulty breathing)       no 11. PREGNANCY:  Is there any chance you are pregnant? When was your last menstrual period?       na  Protocols used: Leg Swelling and Edema-A-AH

## 2024-03-26 ENCOUNTER — Ambulatory Visit (INDEPENDENT_AMBULATORY_CARE_PROVIDER_SITE_OTHER): Admitting: Medical-Surgical

## 2024-03-26 ENCOUNTER — Encounter: Payer: Self-pay | Admitting: Medical-Surgical

## 2024-03-26 VITALS — BP 96/66 | HR 66 | Resp 20 | Ht 60.0 in

## 2024-03-26 DIAGNOSIS — K589 Irritable bowel syndrome without diarrhea: Secondary | ICD-10-CM

## 2024-03-26 DIAGNOSIS — I10 Essential (primary) hypertension: Secondary | ICD-10-CM

## 2024-03-26 DIAGNOSIS — N3281 Overactive bladder: Secondary | ICD-10-CM

## 2024-03-26 DIAGNOSIS — K2100 Gastro-esophageal reflux disease with esophagitis, without bleeding: Secondary | ICD-10-CM | POA: Diagnosis not present

## 2024-03-26 DIAGNOSIS — R600 Localized edema: Secondary | ICD-10-CM | POA: Diagnosis not present

## 2024-03-26 MED ORDER — LISINOPRIL 10 MG PO TABS: 10.0000 mg | ORAL_TABLET | Freq: Every day | ORAL | 1 refills | Status: DC

## 2024-03-26 MED ORDER — PANTOPRAZOLE SODIUM 40 MG PO TBEC: 40.0000 mg | DELAYED_RELEASE_TABLET | Freq: Every day | ORAL | 3 refills | Status: AC

## 2024-03-26 MED ORDER — OXYBUTYNIN CHLORIDE 5 MG PO TABS: 5.0000 mg | ORAL_TABLET | Freq: Three times a day (TID) | ORAL | 1 refills | Status: DC

## 2024-03-26 MED ORDER — DICYCLOMINE HCL 20 MG PO TABS: 20.0000 mg | ORAL_TABLET | Freq: Two times a day (BID) | ORAL | 1 refills | Status: DC | PRN

## 2024-03-26 NOTE — Progress Notes (Signed)
        Established patient visit   History of Present Illness   Discussed the use of AI scribe software for clinical note transcription with the patient, who gave verbal consent to proceed.  History of Present Illness   Anna Bowen is a 63 year old female who presents with leg swelling.  She has experienced leg swelling for a few weeks, with one leg more swollen than the other. There is no shortness of breath. She sleeps on her side with one pillow and breathes without difficulty. Her meals are low in salt. She has not had recent blood work and was hospitalized frequently last year for various issues.  She has arthritis in her knee and has used compression socks in the past but finds them difficult to put on.   Her medications include Protonix  for acid reflux, lisinopril  for blood pressure, oxybutynin  for urinary issues, dicyclomine  for stomach issues, and continuous Macrobid . She needs a refill for her reflux medication. She sometimes has trouble keeping food down, leading to vomiting if she does not take her medication.      Physical Exam   Physical Exam Vitals reviewed.  Constitutional:      General: She is not in acute distress.    Appearance: Normal appearance. She is not ill-appearing.  HENT:     Head: Normocephalic and atraumatic.  Cardiovascular:     Rate and Rhythm: Normal rate and regular rhythm.     Pulses: Normal pulses.     Heart sounds: Normal heart sounds. No murmur heard.    No friction rub. No gallop.  Pulmonary:     Effort: Pulmonary effort is normal. No respiratory distress.     Breath sounds: Normal breath sounds. No wheezing.  Musculoskeletal:     Right lower leg: Edema present.     Left lower leg: Edema present.  Skin:    General: Skin is warm and dry.  Neurological:     Mental Status: She is alert and oriented to person, place, and time.  Psychiatric:        Mood and Affect: Mood normal.        Behavior: Behavior normal.         Thought Content: Thought content normal.        Judgment: Judgment normal.     Assessment & Plan   Assessment and Plan    Lower extremity edema Bilateral lower extremity swelling right worse than left without dyspnea, erythema, pain, or tenderness. Possible causes include immobility, malnutrition, or fluid retention. - Order blood work for renal function and electrolytes. - Consider low-dose furosemide if renal function and electrolytes are normal. - Provide Ace wraps for compression. - Advise removal of wraps at night or when reclining with legs elevated.  Gastroesophageal reflux disease with chronic nausea and vomiting Chronic GERD with nausea and vomiting, controlled with pantoprazole . - Refill pantoprazole  prescription.  Hypertension Chronic hypertension well managed with lisinopril . - Refill lisinopril  prescription.  Overactive bladder Chronic overactive bladder managed with oxybutynin . - Refill oxybutynin  prescription.  Medication management Difficulty with medication refills due to pharmacy issues. Discussed contacting pharmacy and provided medical assistant contact for assistance. - Refill prescriptions for lisinopril , pantoprazole , oxybutynin , and dicyclomine .      Follow up   Return in about 4 weeks (around 04/23/2024) for chronic disease follow up with PCP in person please.  __________________________________ Zada FREDRIK Palin, DNP, APRN, FNP-BC Primary Care and Sports Medicine [facility]

## 2024-03-27 LAB — CMP14+EGFR
ALT: 11 IU/L (ref 0–32)
AST: 17 IU/L (ref 0–40)
Albumin: 3.8 g/dL — ABNORMAL LOW (ref 3.9–4.9)
Alkaline Phosphatase: 95 IU/L (ref 44–121)
BUN/Creatinine Ratio: 37 — ABNORMAL HIGH (ref 12–28)
BUN: 48 mg/dL — ABNORMAL HIGH (ref 8–27)
Bilirubin Total: <0.2 mg/dL (ref 0.0–1.2)
CO2: 19 mmol/L — ABNORMAL LOW (ref 20–29)
Calcium: 8.9 mg/dL (ref 8.7–10.3)
Chloride: 104 mmol/L (ref 96–106)
Creatinine, Ser: 1.29 mg/dL — ABNORMAL HIGH (ref 0.57–1.00)
Globulin, Total: 3.2 g/dL (ref 1.5–4.5)
Glucose: 80 mg/dL (ref 70–99)
Potassium: 5.6 mmol/L — ABNORMAL HIGH (ref 3.5–5.2)
Sodium: 137 mmol/L (ref 134–144)
Total Protein: 7 g/dL (ref 6.0–8.5)
eGFR: 47 mL/min/1.73 — ABNORMAL LOW (ref >59)

## 2024-03-27 LAB — CBC
Hematocrit: 35.7 % (ref 34.0–46.6)
Hemoglobin: 11.3 g/dL (ref 11.1–15.9)
MCH: 32.8 pg (ref 26.6–33.0)
MCHC: 31.7 g/dL (ref 31.5–35.7)
MCV: 104 fL — ABNORMAL HIGH (ref 79–97)
Platelets: 206 x10E3/uL (ref 150–450)
RBC: 3.44 x10E6/uL — ABNORMAL LOW (ref 3.77–5.28)
RDW: 11.4 % — ABNORMAL LOW (ref 11.7–15.4)
WBC: 7 x10E3/uL (ref 3.4–10.8)

## 2024-03-27 LAB — LIPID PANEL
Chol/HDL Ratio: 3.2 ratio (ref 0.0–4.4)
Cholesterol, Total: 145 mg/dL (ref 100–199)
HDL: 46 mg/dL (ref >39)
LDL Chol Calc (NIH): 89 mg/dL (ref 0–99)
Triglycerides: 43 mg/dL (ref 0–149)
VLDL Cholesterol Cal: 10 mg/dL (ref 5–40)

## 2024-03-27 LAB — TSH: TSH: 1.06 u[IU]/mL (ref 0.450–4.500)

## 2024-03-27 LAB — BRAIN NATRIURETIC PEPTIDE: BNP: 48.6 pg/mL (ref 0.0–100.0)

## 2024-03-29 ENCOUNTER — Ambulatory Visit: Payer: Self-pay | Admitting: Medical-Surgical

## 2024-03-29 DIAGNOSIS — R600 Localized edema: Secondary | ICD-10-CM

## 2024-03-29 MED ORDER — FUROSEMIDE 20 MG PO TABS: 20.0000 mg | ORAL_TABLET | Freq: Every day | ORAL | 0 refills | Status: DC

## 2024-04-01 ENCOUNTER — Ambulatory Visit: Payer: Self-pay

## 2024-04-01 NOTE — Telephone Encounter (Signed)
 FYI Only or Action Required?: FYI only for provider.  Patient was last seen in primary care on 03/26/2024 by Willo Mini, NP.  Called Nurse Triage for Lab Results.  Triage Disposition: Call PCP When Office is Open  Patient/caregiver understands and will follow disposition?:       Copied from CRM [id number]. Topic: Clinical - Lab/Test Results >> Apr 01, 2024  5:05 PM Mercer PEDLAR wrote: Reason for CRM: Patient is returning call for her lab results and has some questions.        Reason for Disposition  Caller requesting routine or non-urgent lab result  Answer Assessment - Initial Assessment Questions I have reviewed the result notes with the patient and advised her of the prescription for Lasix . Patient had no further questions at this time.        1. REASON FOR CALL or QUESTION: What is your reason for calling today? or How can I best     Lab results  2. CALLER: Document the source of call. (e.g., laboratory staff, caregiver or patient).     Patient  Protocols used: PCP Call - No Triage-A-AH

## 2024-04-16 ENCOUNTER — Telehealth: Payer: Self-pay | Admitting: Family Medicine

## 2024-04-16 NOTE — Telephone Encounter (Signed)
 Copied from CRM [id number]. Topic: General - Other >> Apr 16, 2024 12:13 PM [name] wrote: Reason for CRM: [employer] for incontinence supplies calling because the provider missed 2 questions on the form that was turned in. The form needed to correct this is the CSRA and answer questions 19 and 20. Provider needs to inital and date.

## 2024-04-20 NOTE — Telephone Encounter (Signed)
 Lease call them back and have them resend the form.  We do not have the original copy to update and refax.

## 2024-04-22 ENCOUNTER — Encounter: Payer: Self-pay | Admitting: Family Medicine

## 2024-04-22 ENCOUNTER — Ambulatory Visit (INDEPENDENT_AMBULATORY_CARE_PROVIDER_SITE_OTHER): Admitting: Family Medicine

## 2024-04-22 VITALS — BP 97/60 | HR 54

## 2024-04-22 DIAGNOSIS — I10 Essential (primary) hypertension: Secondary | ICD-10-CM | POA: Diagnosis not present

## 2024-04-22 DIAGNOSIS — N3281 Overactive bladder: Secondary | ICD-10-CM | POA: Diagnosis not present

## 2024-04-22 DIAGNOSIS — G809 Cerebral palsy, unspecified: Secondary | ICD-10-CM | POA: Diagnosis not present

## 2024-04-22 DIAGNOSIS — E559 Vitamin D deficiency, unspecified: Secondary | ICD-10-CM

## 2024-04-22 DIAGNOSIS — R262 Difficulty in walking, not elsewhere classified: Secondary | ICD-10-CM | POA: Diagnosis not present

## 2024-04-22 DIAGNOSIS — E875 Hyperkalemia: Secondary | ICD-10-CM

## 2024-04-22 MED ORDER — LISINOPRIL 5 MG PO TABS: 5.0000 mg | ORAL_TABLET | Freq: Every day | ORAL | 1 refills | Status: AC

## 2024-04-22 NOTE — Assessment & Plan Note (Signed)
 She feels like she is doing well with the oxybutynin .  She still has incontinence in part because she is unable to transfer back and forth to the bathroom.  She does take nitrofurantoin  for UTI prevention.

## 2024-04-22 NOTE — Patient Instructions (Signed)
 We are decreasing your blood pressure pill.

## 2024-04-22 NOTE — Progress Notes (Signed)
 Established Patient Office Visit  Subjective  Patient ID: Anna Bowen, female    DOB: 07/06/1961  Age: 63 y.o. MRN: [id number]  Chief Complaint  Patient presents with   Medical Management of Chronic Issues    HPI   Hypertension- Pt denies chest pain, SOB, dizziness, or heart palpitations.  Taking meds as directed w/o problems.  Denies medication side effects.    She is here today with her brother she still has some caregivers coming in.  She is in getting incontinence supplies she does have a new wheelchair and she has had that for about 3 or 4 months.  They are also supplying boost and she is drinking that.  She was here recently for some lower extremity swelling but says that after taking the diuretic for a few days it has seemed to resolve and it has not really come back she is doing well in that regard.      ROS    Objective:     BP 97/60   Pulse (!) 54   SpO2 99%    Physical Exam Vitals and nursing note reviewed.  Constitutional:      Appearance: Normal appearance.  HENT:     Head: Normocephalic and atraumatic.  Eyes:     Conjunctiva/sclera: Conjunctivae normal.  Cardiovascular:     Rate and Rhythm: Normal rate and regular rhythm.  Pulmonary:     Effort: Pulmonary effort is normal.     Breath sounds: Normal breath sounds.  Musculoskeletal:     Comments: Lower extremities without edema.  Skin:    General: Skin is warm and dry.  Neurological:     Mental Status: She is alert.  Psychiatric:        Mood and Affect: Mood normal.      No results found for any visits on 04/22/24.    The 10-year ASCVD risk score (Arnett DK, et al., 2019) is: 3.1%    Assessment & Plan:   Problem List Items Addressed This Visit       Cardiovascular and Mediastinum   Essential hypertension, benign - Primary   Blood pressure is a little on the low end today and it was when he came to see my partner Joy.'s were getting to decrease lisinopril  down to 5 mg  daily.      Relevant Medications   lisinopril  (ZESTRIL ) 5 MG tablet   Other Relevant Orders   Basic Metabolic Panel (BMET)   VITAMIN D  25 Hydroxy (Vit-D Deficiency, Fractures)     Nervous and Auditory   Cerebral palsy (HCC)   She now has an Mining engineer wheelchair.  She is unable to walk and even has difficulty with transitioning she usually has to have assistance.      Relevant Orders   Basic Metabolic Panel (BMET)   VITAMIN D  25 Hydroxy (Vit-D Deficiency, Fractures)     Genitourinary   OAB (overactive bladder)   She feels like she is doing well with the oxybutynin .  She still has incontinence in part because she is unable to transfer back and forth to the bathroom.  She does take nitrofurantoin  for UTI prevention.        Other   Vitamin D  deficiency   Due to recheck vitamin D  level.      Inability to walk   Relevant Orders   Basic Metabolic Panel (BMET)   VITAMIN D  25 Hydroxy (Vit-D Deficiency, Fractures)   Other Visit Diagnoses       Hyperkalemia  Relevant Orders   Basic Metabolic Panel (BMET)      Assessment and Plan Assessment & Plan     Return in about 6 months (around 10/23/2024) for Hypertension.    Anna Byars, MD

## 2024-04-22 NOTE — Assessment & Plan Note (Signed)
 She now has an Passenger transport manager.  She is unable to walk and even has difficulty with transitioning she usually has to have assistance.

## 2024-04-22 NOTE — Assessment & Plan Note (Signed)
 Blood pressure is a little on the low end today and it was when he came to see my partner Joy.'s were getting to decrease lisinopril  down to 5 mg daily.

## 2024-04-22 NOTE — Assessment & Plan Note (Signed)
 Due to recheck vitamin D  level.

## 2024-04-23 ENCOUNTER — Ambulatory Visit: Payer: Self-pay | Admitting: Family Medicine

## 2024-04-23 LAB — BASIC METABOLIC PANEL WITH GFR
BUN/Creatinine Ratio: 44 — ABNORMAL HIGH (ref 12–28)
BUN: 56 mg/dL — ABNORMAL HIGH (ref 8–27)
CO2: 17 mmol/L — ABNORMAL LOW (ref 20–29)
Calcium: 9.2 mg/dL (ref 8.7–10.3)
Chloride: 108 mmol/L — ABNORMAL HIGH (ref 96–106)
Creatinine, Ser: 1.28 mg/dL — ABNORMAL HIGH (ref 0.57–1.00)
Glucose: 100 mg/dL — ABNORMAL HIGH (ref 70–99)
Potassium: 4.8 mmol/L (ref 3.5–5.2)
Sodium: 140 mmol/L (ref 134–144)
eGFR: 47 mL/min/1.73 — ABNORMAL LOW (ref >59)

## 2024-04-23 LAB — SPECIMEN STATUS REPORT

## 2024-04-23 LAB — VITAMIN D 25 HYDROXY (VIT D DEFICIENCY, FRACTURES): Vit D, 25-Hydroxy: 33.6 ng/mL (ref 30.0–100.0)

## 2024-04-23 NOTE — Progress Notes (Signed)
 Hi Denise, kidney function is stable at 1.2 you need to drink a little bit more fluid you look very dehydrated on your labs.  This can stress your kidneys.  Your vitamin D  is low.  Recommend taking 25 mcg daily which is over-the-counter.  You just need enough for maintenance dosing you do not need higher doses.

## 2024-04-27 ENCOUNTER — Encounter: Payer: Self-pay | Admitting: Sports Medicine

## 2024-04-27 ENCOUNTER — Telehealth: Payer: Self-pay | Admitting: Family Medicine

## 2024-04-27 NOTE — Telephone Encounter (Signed)
 Copied from CRM [id number]. Topic: Clinical - Order For Equipment >> Apr 23, 2024  1:55 PM [name] wrote: Reason for CRM: Paperwork for incontenience items. Please complete paperwork. [place]  medicaid request has not been completed. 19 and 20 needs to be answered for this form. Change needs to be initaled and dated by provider.  Fax #[phone] Phone # [phone]

## 2024-05-07 NOTE — Telephone Encounter (Signed)
Corrections made and faxed.

## 2024-06-23 ENCOUNTER — Other Ambulatory Visit: Payer: Self-pay | Admitting: Family Medicine

## 2024-06-23 ENCOUNTER — Other Ambulatory Visit: Payer: Self-pay | Admitting: Medical-Surgical

## 2024-06-23 DIAGNOSIS — R600 Localized edema: Secondary | ICD-10-CM

## 2024-06-23 DIAGNOSIS — M25561 Pain in right knee: Secondary | ICD-10-CM

## 2024-09-07 ENCOUNTER — Ambulatory Visit: Payer: Self-pay

## 2024-09-07 NOTE — Telephone Encounter (Signed)
 The patient is scheduled on 09/08/24 with the provider at 1010 am to address the following symptom. Attempted to contact the patient/patient's sister for an update. No answer. Left a brief vm message. Direct call back information provided.

## 2024-09-07 NOTE — Telephone Encounter (Signed)
 FYI Only or Action Required?: FYI only for provider: appointment scheduled on tomorrow morning.  Patient was last seen in primary care on 04/22/2024 by Alvan Dorothyann BIRCH, MD.  Called Nurse Triage reporting Hematuria.  Symptoms began today.  Interventions attempted: Nothing.  Symptoms are: unsure.  Triage Disposition: See Physician Within 24 Hours  Patient/caregiver understands and will follow disposition?: Unsure                  Copied from CRM #[id number]. Topic: Clinical - Red Word Triage >> Sep 07, 2024  2:11 PM Antony RAMAN wrote: Red Word that prompted transfer to Nurse Triage: blood in urine Reason for Disposition  Blood in urine  (Exception: Could be normal menstrual bleeding.)  Answer Assessment - Initial Assessment Questions Call from pt's sister. Pt not available. Sister states that pt is having blood in urine. Sister was unable to answer additional triage questions. Sister will call pt back to inquire about amount of blood, pain and fever. Sister will call back if any of these additional questions need updating for additional triage. Appt scheduled for tomorrow morning.           1. COLOR of URINE: Describe the color of the urine.  (e.g., tea-colored, pink, red, bloody) Do you have blood clots in your urine? (e.g., none, pea, grape, small coin)     Blood in urine 2. ONSET: When did the bleeding start?      today 3. EPISODES: How many times has there been blood in the urine? or How many times today?     unknown 4. PAIN with URINATION: Is there any pain with passing your urine? If Yes, ask: How bad is the pain?  (Scale 1-10; or mild, moderate, severe)     unknown 5. FEVER: Do you have a fever? If Yes, ask: What is your temperature, how was it measured, and when did it start?     unknown 6. ASSOCIATED SYMPTOMS: Are you passing urine more frequently than usual?     unknown 7. OTHER SYMPTOMS: Do you have any other symptoms?  (e.g., back/flank pain, abdomen pain, vomiting)     unknown  Protocols used: Urine - Blood In-A-AH

## 2024-09-07 NOTE — Telephone Encounter (Signed)
 The patient returned a call back to the clinic. Per the patient, denies having any pain or discomfort when voiding, fever or flank pain and or frequent urinations.

## 2024-09-08 ENCOUNTER — Encounter: Payer: Self-pay | Admitting: Family Medicine

## 2024-09-08 ENCOUNTER — Ambulatory Visit: Admitting: Family Medicine

## 2024-09-08 VITALS — BP 122/68 | HR 63

## 2024-09-08 DIAGNOSIS — R319 Hematuria, unspecified: Secondary | ICD-10-CM | POA: Diagnosis not present

## 2024-09-08 DIAGNOSIS — N390 Urinary tract infection, site not specified: Secondary | ICD-10-CM | POA: Diagnosis not present

## 2024-09-08 DIAGNOSIS — N898 Other specified noninflammatory disorders of vagina: Secondary | ICD-10-CM | POA: Diagnosis not present

## 2024-09-08 MED ORDER — CEPHALEXIN 500 MG PO CAPS: 500.0000 mg | ORAL_CAPSULE | Freq: Three times a day (TID) | ORAL | 0 refills | Status: AC

## 2024-09-08 NOTE — Progress Notes (Signed)
 "  Acute Office Visit  Patient ID: Devory Mckinzie, female    DOB: 1961/04/28, 64 y.o.   MRN: [id number]  PCP: Alvan Dorothyann BIRCH, MD  Chief Complaint  Patient presents with   Hematuria    X 2 days she denies and n/v/d abdominal pain or back pain    Subjective:     HPI  Discussed the use of AI scribe software for clinical note transcription with the patient, who gave verbal consent to proceed.  History of Present Illness Lane Kjos is a 64 year old female who presents with blood in her urine. She is accompanied by her brother.  She is wheelchair-bound.  Hematuria - Blood in urine noted for the past several days, identified by aides rather than by direct visualization - No dysuria, urinary discomfort, or pressure - No low back pain with urination - Uncertain about presence of fever - Currently taking nitrofurantoin  for urinary tract infection prophylaxis  Musculoskeletal pain - Shoulder pain described as a muscle cramp - Discomfort causes difficulty laying back  Medication use - Current medications include nitrofurantoin , lisinopril , oxybutynin , and pantoprazole  - Uncertainty regarding the color of nitrofurantoin  capsule due to possible pharmacy substitutions  She also reports vision loss they had tried to get a eye exam but it was a virtual visit and they really need to go somewhere where the physician is in person.   ROS     Objective:    BP 122/68   Pulse 63   SpO2 100%    Her brother was able to lift her and put her on the exam table assistant was present for the exam  Physical Exam Exam conducted with a chaperone present.  Genitourinary:    Labia:        Right: No rash.        Left: No rash.       Comments: Along the left groin crease there was some slight erythema and what looks like more chronic inflammation of the skin.  No active wound or bleeding or cracking of the skin.  The vaginal opening is just slightly erythematous.  But  again no discrete lesion.  No abnormal discharge.  No significant erythema around the urethra.      No results found for any visits on 09/08/24.     Assessment & Plan:   Problem List Items Addressed This Visit   None Visit Diagnoses       Recurrent UTI    -  Primary   Relevant Medications   cephALEXin  (KEFLEX ) 500 MG capsule     Hematuria, unspecified type       Relevant Medications   cephALEXin  (KEFLEX ) 500 MG capsule   Other Relevant Orders   NuSwab Vaginitis Plus (VG+)     Vaginal irritation       Relevant Orders   NuSwab Vaginitis Plus (VG+)       Assessment and Plan Assessment & Plan Recurrent urinary tract infection Possible UTI with hematuria - Initiated antibiotics due to risk of rapid progression if bladder-related. - Started Keflex  TID for 5 days. - Held nitrofurantoin  while on antibiotic therapy. - Sent vaginal swab for analysis.  Hematuria Possibly related to UTI or other causes . No visible lesions or cysts. - Await results of vaginal swab to guide further management.  Vaginal irritation Vaginal swab performed to assess for bacterial or yeast infection. - Await results of vaginal swab to guide further management.   Meds ordered this encounter  Medications   cephALEXin  (KEFLEX ) 500 MG capsule    Sig: Take 1 capsule (500 mg total) by mouth 3 (three) times daily.    Dispense:  15 capsule    Refill:  0    Return if symptoms worsen or fail to improve.  Dorothyann Byars, MD [facility] Primary Care & Sports Medicine at [facility]   "

## 2024-09-08 NOTE — Patient Instructions (Addendum)
 Hold the nitrofurantoin  while on the cephalexin  (antibiotic)

## 2024-09-11 LAB — NUSWAB VAGINITIS PLUS (VG+)
Candida albicans, NAA: NEGATIVE
Candida glabrata, NAA: NEGATIVE
Chlamydia trachomatis, NAA: NEGATIVE
Neisseria gonorrhoeae, NAA: NEGATIVE
Trich vag by NAA: NEGATIVE

## 2024-09-13 ENCOUNTER — Ambulatory Visit: Payer: Self-pay | Admitting: Family Medicine

## 2024-09-14 NOTE — Progress Notes (Signed)
 Call [facility] [place] and let her know that the vaginal swab was normal no sign of infection or yeast which is great.  Just wanted to see if they were noticing any more blood or if it seems better.

## 2024-09-14 NOTE — Telephone Encounter (Unsigned)
 Copied from CRM [id number]. Topic: Clinical - Lab/Test Results >> Sep 14, 2024 11:31 AM Joesph NOVAK wrote: Reason for CRM: relayed results, patient is still seeing little bit of blood

## 2024-09-17 NOTE — Progress Notes (Signed)
 She be able to do stool cards since I did not see any vaginal wounds and the swab is negative for any type of infection.  Does she think it is coming more from the vaginal area or the rectal area?

## 2024-09-19 ENCOUNTER — Other Ambulatory Visit: Payer: Self-pay | Admitting: Family Medicine

## 2024-09-19 ENCOUNTER — Other Ambulatory Visit: Payer: Self-pay | Admitting: Medical-Surgical

## 2024-09-19 DIAGNOSIS — N3281 Overactive bladder: Secondary | ICD-10-CM

## 2024-09-19 DIAGNOSIS — N390 Urinary tract infection, site not specified: Secondary | ICD-10-CM

## 2024-09-19 DIAGNOSIS — K589 Irritable bowel syndrome without diarrhea: Secondary | ICD-10-CM

## 2024-09-20 NOTE — Progress Notes (Signed)
 Attempted call to patient. Left a voice mail message requesting a return call.

## 2024-09-22 NOTE — Progress Notes (Signed)
 Attempted call to patient. Left a voicemail message requesting a return call. Main # left for return call.

## 2024-09-23 NOTE — Progress Notes (Signed)
 Attempted call to patient. Left a voice mail message requesting a return call.

## 2024-09-24 ENCOUNTER — Telehealth: Payer: Self-pay

## 2024-09-24 DIAGNOSIS — N898 Other specified noninflammatory disorders of vagina: Secondary | ICD-10-CM

## 2024-09-24 NOTE — Telephone Encounter (Signed)
 Ok then I would recommend Vaginal/pelvic US  for further workup if she is ok with that.  We need to take a look at the uterus.

## 2024-09-24 NOTE — Telephone Encounter (Signed)
 Copied from CRM #[id number]. Topic: Clinical - Request for Lab/Test Order >> Sep 24, 2024 11:29 AM Antony RAMAN wrote: Reason for CRM: patient called back and is okay with vaginal/pelvic US 

## 2024-09-24 NOTE — Telephone Encounter (Signed)
 Patient advised the prescriptions are ready for pick up at pharmacy.

## 2024-09-24 NOTE — Telephone Encounter (Signed)
 Copied from CRM #[id number]. Topic: Clinical - Medication Refill >> Sep 24, 2024 11:30 AM Antony RAMAN wrote: Medication: oxybutynin  (DITROPAN ) 5 MG tablet lisinopril  (ZESTRIL ) 5 MG tablet pantoprazole  (PROTONIX ) 40 MG tablet nitrofurantoin  (MACRODANTIN ) 100 MG capsule  Has the patient contacted their pharmacy? Yes (Agent: If no, request that the patient contact the pharmacy for the refill. If patient does not wish to contact the pharmacy document the reason why and proceed with request.) (Agent: If yes, when and what did the pharmacy advise?)  This is the patient's preferred pharmacy:  [facility] [phone] - [place] [place] - [address] [place] [address] Phone: [phone] Fax: [phone]  Is this the correct pharmacy for this prescription? Yes If no, delete pharmacy and type the correct one.   Has the prescription been filled recently? No  Is the patient out of the medication? No, 4 each left  Has the patient been seen for an appointment in the last year OR does the patient have an upcoming appointment? Yes  Can we respond through MyChart? Yes  Agent: Please be advised that Rx refills may take up to 3 business days. We ask that you follow-up with your pharmacy.

## 2024-09-24 NOTE — Telephone Encounter (Signed)
 Pended ultrasound. I believe I have pended the correct ultrasound.

## 2024-10-01 ENCOUNTER — Ambulatory Visit: Payer: Self-pay

## 2024-10-01 NOTE — Telephone Encounter (Signed)
 Please see prior note.  We had ordered an ultrasound to evaluate the pelvis for the bleeding.  It was ordered a week ago and it does not look like it has been scheduled or performed

## 2024-10-01 NOTE — Telephone Encounter (Signed)
" °  FYI Only or Action Required?: Action required by provider: clinical question for provider and update on patient condition.  Patient was last seen in primary care on 09/08/2024 by Alvan Dorothyann BIRCH, MD.  Called Nurse Triage reporting Vaginal Discharge.  Symptoms began several days ago.  Interventions attempted: Nothing.  Symptoms are: gradually worsening.  Triage Disposition: See PCP When Office is Open (Within 3 Days)  Patient/caregiver understands and will follow disposition?: No, refuses disposition Message from Dudley F sent at 10/01/2024 11:53 AM EST  Reason for Triage: Patient and her Aide are calling in because patient was seen in the office a few weeks ago and was prescribed an antibiotic. Patient completed the antibiotic and is now experiencing heavy, bloody discharge, vaginal irritation and burning   Reason for Disposition  Bad smelling vaginal discharge  Answer Assessment - Initial Assessment Questions Caller is Randine, patient's caregiver/aid.  Verbal consent received from patient to discuss patient's health with caregiver, Randine   Please call patient's sister, Jori [phone] to discuss treatment  Patient wants to wait until March to be seen  1. DISCHARGE: Describe the discharge. (e.g., white, yellow, green, [place], foamy, cottage cheese-like)     Itching, burning, blood tinged creamy colored discharge 2. ODOR: Is there a bad odor?     Foul odor 3. ONSET: When did the discharge begin?     Same symptoms present as when seen on 09/08/2024, cleared up for a while with antibiotics, but has returned 4. RASH: Is there a rash in the genital area? If Yes, ask: Describe it. (e.g., redness, blisters, sores, bumps)     Redness present, looks like irritation 5. ABDOMEN PAIN: Are you having any abdomen pain? If Yes, ask: What does it feel like?  (e.g., crampy, dull, intermittent, constant)      denies 6. ABDOMEN PAIN SEVERITY: If present, ask: How bad is  it? (e.g., Scale 1-10; mild, moderate, or severe)     N/a 7. CAUSE: What do you think is causing the discharge? Have you had the same problem before? What happened then?     unsure 8. OTHER SYMPTOMS: Do you have any other symptoms? (e.g., fever, itching, urination pain, vaginal bleeding, vaginal foreign body)     Bleeding present, but unsure of source.  Does not know if it is vaginal, or from scratching  Protocols used: Vaginal Discharge-A-AH  "

## 2024-10-01 NOTE — Telephone Encounter (Signed)
 Pt and her Aide are calling in because patient was seen in the office a few weeks ago and was prescribed an antibiotic. Patient completed the antibiotic and is now experiencing heavy, bloody discharge, vaginal irritation and burning.  Pt has an upcoming appointment for 10/14/2024. Please advise.

## 2024-10-01 NOTE — Telephone Encounter (Signed)
 Attempted call to patient. Left a voicemail message requesting a return call. Left direct # for call back.

## 2024-10-14 ENCOUNTER — Ambulatory Visit: Admitting: Family Medicine
